# Patient Record
Sex: Female | Born: 1970
Health system: Southern US, Community
[De-identification: ages and names within clinical notes are randomized; demographics above are authoritative.]

## PROBLEM LIST (undated history)

## (undated) DIAGNOSIS — F4310 Post-traumatic stress disorder, unspecified: Secondary | ICD-10-CM

## (undated) DIAGNOSIS — E785 Hyperlipidemia, unspecified: Secondary | ICD-10-CM

## (undated) DIAGNOSIS — F909 Attention-deficit hyperactivity disorder, unspecified type: Secondary | ICD-10-CM

## (undated) DIAGNOSIS — E559 Vitamin D deficiency, unspecified: Secondary | ICD-10-CM

## (undated) DIAGNOSIS — F319 Bipolar disorder, unspecified: Secondary | ICD-10-CM

## (undated) DIAGNOSIS — F419 Anxiety disorder, unspecified: Secondary | ICD-10-CM

## (undated) HISTORY — DX: Bipolar disorder, unspecified: F31.9

## (undated) HISTORY — DX: Vitamin D deficiency, unspecified: E55.9

## (undated) HISTORY — PX: TUBAL LIGATION: SHX77

## (undated) HISTORY — PX: CARPAL TUNNEL RELEASE: SHX101

## (undated) HISTORY — DX: Anxiety disorder, unspecified: F41.9

## (undated) HISTORY — PX: COLONOSCOPY: SHX174

## (undated) HISTORY — DX: Hyperlipidemia, unspecified: E78.5

---

## 2003-11-23 ENCOUNTER — Emergency Department (HOSPITAL_COMMUNITY): Admission: EM | Admit: 2003-11-23 | Discharge: 2003-11-23 | Payer: Self-pay | Admitting: Emergency Medicine

## 2010-05-04 ENCOUNTER — Encounter: Payer: Self-pay | Admitting: Internal Medicine

## 2011-09-30 DIAGNOSIS — Z9851 Tubal ligation status: Secondary | ICD-10-CM | POA: Insufficient documentation

## 2012-09-25 ENCOUNTER — Ambulatory Visit (INDEPENDENT_AMBULATORY_CARE_PROVIDER_SITE_OTHER): Payer: Self-pay | Admitting: Internal Medicine

## 2012-09-25 ENCOUNTER — Ambulatory Visit: Payer: Self-pay

## 2012-09-25 VITALS — BP 130/82 | HR 79 | Temp 98.3°F | Resp 16 | Ht 62.25 in | Wt 203.0 lb

## 2012-09-25 DIAGNOSIS — F411 Generalized anxiety disorder: Secondary | ICD-10-CM

## 2012-09-25 DIAGNOSIS — F988 Other specified behavioral and emotional disorders with onset usually occurring in childhood and adolescence: Secondary | ICD-10-CM

## 2012-09-25 DIAGNOSIS — Z6836 Body mass index (BMI) 36.0-36.9, adult: Secondary | ICD-10-CM

## 2012-09-25 MED ORDER — METHYLPHENIDATE HCL 20 MG PO TABS: 20.0000 mg | ORAL_TABLET | Freq: Three times a day (TID) | ORAL | Status: DC

## 2012-09-25 NOTE — Progress Notes (Signed)
Subjective:    Patient ID: Melissa Davies, female    DOB: Sep 20, 1970, 42 y.o.   MRN: [id number]   Patient in today seeking a primary care physician and is requesting a refill on her Methylphenidate 20mg . She was a former patient at   [facility]. She was first diagnosed by Dr Carolanne Grumbling and she was being treated for depression and anxiety. And he  Retired, she was referred to [facility] but has not been happy with her treatment there. Dr. Ladona Ridgel discovered that she was suffering from adult attention deficit disorder and when he started her on stimulants or anxiety disappeared. She has been on Methylphenidate for the past 2 years. Patient is very talkative during visit, she became tearful during the visit as well when discussing her treatment at [facility]. Her fiance is present in the room. He mentions that patient has been scratching her head a lot and leaving sores in her scalp. Patient admits that she has been scratching her head, but it this is only because of her nerves. She becomes tearful when discussing the issue. 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  She was able to go back to school and completed a degree cosmetology/medicine helps greatly at work/she rarely takes it on the weekend    Anxiety Presents for initial visit. Onset was 1 to 4 weeks ago. The problem has been unchanged. Symptoms include decreased concentration, depressed mood, irritability, nervous/anxious behavior, panic and restlessness. Patient reports no chest pain, compulsions, dizziness, hyperventilation, nausea, shortness of breath or suicidal ideas. Symptoms occur constantly. The severity of symptoms is interfering with daily activities and moderate. The symptoms are aggravated by medication withdrawal (Symptpms onset when Methylphenidate 20mg  ran out). The quality of sleep is fair. Nighttime awakenings: occasional.   Her past medical history is significant for depression. (ADHD)  Treatments tried: Methylphenidate 20mg . The treatment provided significant relief. Compliance with prior treatments has been good.      Review of Systems  Constitutional: Positive for activity change (doing more as she feels more anxious), irritability and fatigue (is working 7 days/week). Negative for fever and chills.  HENT: Negative for trouble swallowing, neck pain, neck stiffness, sinus pressure and tinnitus.   Eyes: Negative.  Negative for visual disturbance.  Respiratory: Negative for cough, chest tightness and shortness of breath.   Cardiovascular: Negative for chest pain.  Gastrointestinal: Positive for constipation. Negative for nausea, vomiting, abdominal pain and abdominal distention.  Endocrine: Negative.  Negative for cold intolerance and heat intolerance.  Genitourinary: Negative.   Musculoskeletal: Negative for back pain.  Skin: Negative.   Allergic/Immunologic: Negative.   Neurological: Negative for dizziness, tremors, weakness, light-headedness, numbness and headaches.  Psychiatric/Behavioral: Positive for decreased concentration. Negative for suicidal ideas. The patient is nervous/anxious.        Objective:   Physical Exam  Constitutional: She is oriented to person, place, and time. She appears well-developed and well-nourished.  HENT:  Head: Normocephalic and atraumatic.  Mouth/Throat: Oropharynx is clear and moist.  Eyes: Conjunctivae are normal. Pupils are equal, round, and reactive to light.  Neck: Normal range of motion. Neck supple.  Cardiovascular: Normal rate, regular rhythm and normal heart sounds.  Exam reveals no gallop and no friction rub.   No murmur heard. Pulmonary/Chest: Effort normal and breath sounds normal. No respiratory distress. She has no wheezes. She exhibits no tenderness.  Abdominal: Soft. Bowel sounds are normal.  Musculoskeletal: Normal range of motion.  Neurological: She  is alert and oriented to person, place, and time.  Skin: Skin  is warm and dry.  Psychiatric: Her speech is normal. Judgment and thought content normal. Her mood appears anxious. She is hyperactive. Cognition and memory are normal.  Alternating mood- between hyperactive/talkitive to crying          Assessment & Plan:   Assessment- ADHD--   Generalized anxiety disorder   BMI 36                      - Medication refill  Plan- Methylphenidate 20mg          -Return in 3 months          -Education provided regarding anxiety and irritability triggers  Meds ordered this encounter  Medications  . buPROPion (WELLBUTRIN XL) 150 MG 24 hr tablet----she has plenty of this per Dr Karie Schwalbe    Carol Ada: Take 450 mg by mouth daily.  . methylphenidate (RITALIN) 20 MG tablet    Sig: Take 1 tablet (20 mg total) by mouth 3 (three) times daily.    Dispense:  90 tablet    Refill:  0  . methylphenidate (RITALIN) 20 MG tablet    Sig: Take 1 tablet (20 mg total) by mouth 3 (three) times daily.    Dispense:  90 tablet    Refill:  0  . methylphenidate (RITALIN) 20 MG tablet    Sig: Take 1 tablet (20 mg total) by mouth 3 (three) times daily. 10/26/14    Dispense:  90 tablet    Refill:  0  . methylphenidate (RITALIN) 20 MG tablet    Sig: Take 1 tablet (20 mg total) by mouth 3 (three) times daily. 11/25/12    Dispense:  90 tablet    Refill:  0

## 2012-09-26 DIAGNOSIS — Z6836 Body mass index (BMI) 36.0-36.9, adult: Secondary | ICD-10-CM | POA: Insufficient documentation

## 2012-09-26 DIAGNOSIS — F988 Other specified behavioral and emotional disorders with onset usually occurring in childhood and adolescence: Secondary | ICD-10-CM | POA: Insufficient documentation

## 2012-09-26 DIAGNOSIS — F411 Generalized anxiety disorder: Secondary | ICD-10-CM | POA: Insufficient documentation

## 2012-09-28 NOTE — Progress Notes (Signed)
Left msg for pt to schedule 3 month f-up with Dr. Merla Riches.

## 2012-09-29 NOTE — Progress Notes (Signed)
Appt made for 12/28/12.

## 2012-12-07 ENCOUNTER — Telehealth: Payer: Self-pay

## 2012-12-07 NOTE — Telephone Encounter (Signed)
buPROPion (WELLBUTRIN XL) 150 MG 24 hr tablet methylphenidate (RITALIN) 20 MG tablet   Patient misplaced her scripts when she recently moved.  Needs her meds.    [phone]

## 2012-12-10 NOTE — Telephone Encounter (Signed)
In your box for review

## 2012-12-10 NOTE — Telephone Encounter (Signed)
Needs nccsrs query 1st

## 2012-12-12 MED ORDER — BUPROPION HCL ER (XL) 150 MG PO TB24: 450.0000 mg | ORAL_TABLET | Freq: Every day | ORAL | Status: DC

## 2012-12-12 NOTE — Telephone Encounter (Signed)
Patient advised.

## 2012-12-12 NOTE — Telephone Encounter (Signed)
Reordered wellbutrin/but can't replace lost rx for controlled substances

## 2012-12-28 ENCOUNTER — Encounter: Payer: Self-pay | Admitting: Internal Medicine

## 2012-12-28 ENCOUNTER — Ambulatory Visit: Payer: Self-pay | Admitting: Internal Medicine

## 2012-12-28 VITALS — BP 112/70 | HR 82 | Temp 99.4°F | Resp 16 | Ht 61.0 in | Wt 199.0 lb

## 2012-12-28 DIAGNOSIS — F411 Generalized anxiety disorder: Secondary | ICD-10-CM

## 2012-12-28 DIAGNOSIS — F988 Other specified behavioral and emotional disorders with onset usually occurring in childhood and adolescence: Secondary | ICD-10-CM

## 2012-12-28 MED ORDER — CLONAZEPAM 1 MG PO TABS: ORAL_TABLET | ORAL | Status: DC

## 2012-12-28 MED ORDER — BUPROPION HCL ER (XL) 150 MG PO TB24: 450.0000 mg | ORAL_TABLET | Freq: Every day | ORAL | Status: DC

## 2012-12-28 MED ORDER — METHYLPHENIDATE HCL 20 MG PO TABS: 20.0000 mg | ORAL_TABLET | Freq: Three times a day (TID) | ORAL | Status: DC

## 2012-12-28 NOTE — Progress Notes (Signed)
  Subjective:    Patient ID: Melissa Davies, female    DOB: February 03, 1971, 42 y.o.   MRN: [id number]  HPIf/u Transfer from Dr Diona Browner Patient Active Problem List   Diagnosis Date Noted  . ADD (attention deficit disorder) 09/26/2012  . Anxiety state, unspecified 09/26/2012  . 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 methylphenidate (RITALIN) 20 MG tablet    Sig: Take 1 tablet (20 mg total) by mouth 3 (three) times daily.    Dispense:  90 tablet    Refill:  0  . methylphenidate (RITALIN) 20 MG tablet    Sig: Take 1 tablet (20 mg total) by mouth 3 (three) times daily. For 01/26/13    Dispense:  90 tablet    Refill:  0  . methylphenidate (RITALIN) 20 MG tablet    Sig: Take 1 tablet (20 mg total) by mouth 3 (three) times daily. 02/26/15    Dispense:  90 tablet    Refill:  0                 . clonazePAM (KLONOPIN) 1 MG tablet    Sig: One as needed for panic attack    Dispense:  20 tablet    Refill:  1  . buPROPion (WELLBUTRIN XL) 150 MG 24 hr tablet    Sig: Take 3 tablets (450 mg total) by mouth daily.    Dispense:  90 tablet    Refill:  2   Reck 3mos Ref daughter to famserv and yfoc

## 2013-03-29 ENCOUNTER — Ambulatory Visit: Payer: Self-pay | Admitting: Internal Medicine

## 2013-04-03 ENCOUNTER — Ambulatory Visit: Payer: Self-pay | Admitting: Internal Medicine

## 2013-05-03 ENCOUNTER — Ambulatory Visit (INDEPENDENT_AMBULATORY_CARE_PROVIDER_SITE_OTHER): Payer: 59 | Admitting: Emergency Medicine

## 2013-05-03 VITALS — BP 126/88 | HR 103 | Temp 98.1°F | Resp 18 | Ht 65.0 in | Wt 209.0 lb

## 2013-05-03 DIAGNOSIS — J209 Acute bronchitis, unspecified: Secondary | ICD-10-CM

## 2013-05-03 MED ORDER — AZITHROMYCIN 250 MG PO TABS: ORAL_TABLET | ORAL | Status: DC

## 2013-05-03 MED ORDER — GUAIFENESIN-DM 100-10 MG/5ML PO SYRP: 5.0000 mL | ORAL_SOLUTION | ORAL | Status: DC | PRN

## 2013-05-03 NOTE — Progress Notes (Signed)
[facility] [address] [place] [address] [phone]  Date:  05/03/2013   Name:  Melissa Davies   DOB:  [birth year]   MRN:  [id number]  PCP:  No PCP Per Patient    Chief Complaint: Chills, Fever, Dizziness, Otalgia, Sore Throat, Sinus Congestion and Cough   History of Present Illness:  Melissa Davies is a 43 y.o. very pleasant female patient who presents with the following:  Ill two weeks with cough.  Dizzy, nasal congestion.  Has malaise and myalgias with fatigue since Friday.  Cough productive of purulent sputum.  No wheezing or shortness of breath.  No nausea or vomiting.  No rash or stool change.  Sore throat. Temperatures in the 100 range.  No improvement with over the counter medications or other home remedies. Denies other complaint or health concern today.   Patient Active Problem List   Diagnosis Date Noted  . ADD (attention deficit disorder) 09/26/2012  . Anxiety state, unspecified 09/26/2012  . BMI 36.0-36.9,adult 09/26/2012    Past Medical History  Diagnosis Date  . Anxiety     Past Surgical History  Procedure Laterality Date  . Tubal ligation      History  Substance Use Topics  . Smoking status: Former Games developer  . Smokeless tobacco: Not on file  . Alcohol Use: No    Family History  Problem Relation Age of Onset  . ADD / ADHD Son     No Known Allergies  Medication list has been reviewed and updated.  Current Outpatient Prescriptions on File Prior to Visit  Medication Sig Dispense Refill  . buPROPion (WELLBUTRIN XL) 150 MG 24 hr tablet Take 3 tablets (450 mg total) by mouth daily.  90 tablet  2  . methylphenidate (RITALIN) 20 MG tablet Take 1 tablet (20 mg total) by mouth 3 (three) times daily. For 01/26/13  90 tablet  0  . clonazePAM (KLONOPIN) 1 MG tablet One as needed for panic attack  20 tablet  1  . methylphenidate (RITALIN) 20 MG tablet Take 1 tablet (20 mg total) by mouth 3 (three) times daily. 11/25/12  90  tablet  0  . methylphenidate (RITALIN) 20 MG tablet Take 1 tablet (20 mg total) by mouth 3 (three) times daily.  90 tablet  0  . methylphenidate (RITALIN) 20 MG tablet Take 1 tablet (20 mg total) by mouth 3 (three) times daily. 02/26/15  90 tablet  0   No current facility-administered medications on file prior to visit.    Review of Systems:  As per HPI, otherwise negative.    Physical Examination: Filed Vitals:   05/03/13 1340  BP: 126/88  Pulse: 103  Temp: 98.1 F (36.7 C)  Resp: 18   Filed Vitals:   05/03/13 1340  Height: 5\' 5"  (1.651 m)  Weight: 209 lb (94.802 kg)   Body mass index is 34.78 kg/(m^2). Ideal Body Weight: Weight in (lb) to have BMI = 25: 149.9  GEN: WDWN, NAD, Non-toxic, A & O x 3 HEENT: Atraumatic, Normocephalic. Neck supple. No masses, No LAD. Ears and Nose: No external deformity. CV: RRR, No M/G/R. No JVD. No thrill. No extra heart sounds. PULM: CTA B, no wheezes, crackles, rhonchi. No retractions. No resp. distress. No accessory muscle use. ABD: S, NT, ND, +BS. No rebound. No HSM. EXTR: No c/c/e NEURO Normal gait.  PSYCH: Normally interactive. Conversant. Not depressed or anxious appearing.  Calm demeanor.    Assessment and Plan: Bronchitis  zpak Robitussin dm  Signed,  Phillips OdorJeffery Jaelon Gatley, MD

## 2013-05-03 NOTE — Patient Instructions (Signed)

## 2013-05-15 NOTE — Progress Notes (Signed)
PA approved for Ritalin through 05/11/14. Pharm faxed.

## 2013-05-24 ENCOUNTER — Ambulatory Visit (INDEPENDENT_AMBULATORY_CARE_PROVIDER_SITE_OTHER): Payer: 59 | Admitting: Internal Medicine

## 2013-05-24 ENCOUNTER — Encounter: Payer: Self-pay | Admitting: Internal Medicine

## 2013-05-24 VITALS — BP 128/84 | HR 83 | Temp 98.3°F | Resp 16 | Ht 60.5 in | Wt 205.4 lb

## 2013-05-24 DIAGNOSIS — F329 Major depressive disorder, single episode, unspecified: Secondary | ICD-10-CM

## 2013-05-24 DIAGNOSIS — Z23 Encounter for immunization: Secondary | ICD-10-CM

## 2013-05-24 DIAGNOSIS — F988 Other specified behavioral and emotional disorders with onset usually occurring in childhood and adolescence: Secondary | ICD-10-CM

## 2013-05-24 DIAGNOSIS — F411 Generalized anxiety disorder: Secondary | ICD-10-CM

## 2013-05-24 MED ORDER — BUPROPION HCL ER (XL) 150 MG PO TB24: 450.0000 mg | ORAL_TABLET | Freq: Every day | ORAL | Status: DC

## 2013-05-24 MED ORDER — METHYLPHENIDATE HCL 20 MG PO TABS: 20.0000 mg | ORAL_TABLET | Freq: Three times a day (TID) | ORAL | Status: DC

## 2013-05-24 MED ORDER — MIRTAZAPINE 30 MG PO TABS: 30.0000 mg | ORAL_TABLET | Freq: Every day | ORAL | Status: DC

## 2013-05-24 MED ORDER — ALPRAZOLAM 0.25 MG PO TABS: 0.2500 mg | ORAL_TABLET | Freq: Two times a day (BID) | ORAL | Status: DC | PRN

## 2013-05-24 NOTE — Progress Notes (Signed)
Subjective:    Patient ID: Melissa Davies, female    DOB: 26-May-1970, 43 y.o.   MRN: [id number] This chart was scribed for [name], MD by Valera Castle, ED Scribe. This patient was seen in room 24 and the patient's care was started at 1:17 PM.  Chief Complaint  Patient presents with  . Follow-up    medications   HPI Melissa Davies is a 43 y.o. female Pt presents for a medication follow up.   She states her medications are not working. She reports breaking out in a rash 09/14 due to the Klonopin she was taking for anxiety. She reports needing Wellbutrin 150 mg to get out of bed in the mornings, but states the Ritalin 20 mg she has been taking for her h/o ADD has not been working well for her.   She reports constant anxiety, stress, anger, lack of patience from dealing with her ex husband. She reports it is hard to ignore her ex husband since they have kids together and he is manipulative. She states her daughter has been getting into trouble, which has exacerbated the situation. She has been involved in ongoing court trials with him, stating she is having trouble with court decisions, justice. She reports crying for no reason the other day while driving and states she doesn't know what is going on. She reports her current husband has been supporting her well, but is still having a hard time dealing with everything.   She reports trouble sleeping at night due to her symptoms. She reports after first getting divorced she had headaches for 6 months. She states she took Xanax prescribed her for her panic attacks at night, and after taking one tablet she would fall asleep right away. She reports needing something that won't make her quite as drowsy.   PCP - No PCP Per Patient  Patient Active Problem List   Diagnosis Date Noted  . ADD (attention deficit disorder) 09/26/2012  . Anxiety state, unspecified 09/26/2012  . BMI 36.0-36.9,adult 09/26/2012   Prior to Admission  medications   Medication Sig Start Date End Date Taking? Authorizing Provider  azithromycin (ZITHROMAX) 250 MG tablet Take 2 tabs PO x 1 dose, then 1 tab PO QD x 4 days 05/03/13   Phillips Odor, MD  buPROPion (WELLBUTRIN XL) 150 MG 24 hr tablet Take 3 tablets (450 mg total) by mouth daily. 12/28/12   Tonye Pearson, MD  clonazePAM Scarlette Calico) 1 MG tablet One as needed for panic attack 12/28/12   Tonye Pearson, MD  guaiFENesin-dextromethorphan [facility] DM) 100-10 MG/5ML syrup Take 5 mLs by mouth every 4 (four) hours as needed for cough. 05/03/13   Phillips Odor, MD  methylphenidate (RITALIN) 20 MG tablet Take 1 tablet (20 mg total) by mouth 3 (three) times daily. 11/25/12 09/25/12   Tonye Pearson, MD  methylphenidate (RITALIN) 20 MG tablet Take 1 tablet (20 mg total) by mouth 3 (three) times daily. 12/28/12   Tonye Pearson, MD  methylphenidate (RITALIN) 20 MG tablet Take 1 tablet (20 mg total) by mouth 3 (three) times daily. For 01/26/13 12/28/12   Tonye Pearson, MD  methylphenidate (RITALIN) 20 MG tablet Take 1 tablet (20 mg total) by mouth 3 (three) times daily. 02/26/15 12/28/12   Tonye Pearson, MD   Review of Systems  Psychiatric/Behavioral: Positive for dysphoric mood, decreased concentration and agitation. Negative for suicidal ideas and self-injury. The patient is nervous/anxious.       Objective:  Physical Exam Nursing note and vitals reviewed. Constitutional: Pt is oriented to person, place, and time. Pt appears well-developed and well-nourished.  HENT:  Head: Normocephalic and atraumatic.  Eyes: EOM are normal. Pupils are equal, round, and reactive to light.  Neck: Neck supple.  Cardiovascular: Normal rate. Pulmonary/Chest: Effort normal. Abdominal:  Musculoskeletal: Normal range of motion.  Neurological: Pt is alert and oriented to person, place, and time.  Skin: Skin is warm and dry.  Psychiatric: Thought content normal. Her mood appears anxious,  dysphoric.   BP 128/84  Pulse 83  Temp(Src) 98.3 F (36.8 C) (Oral)  Resp 16  Ht 5' 0.5" (1.537 m)  Wt 205 lb 6.4 oz (93.169 kg)  BMI 39.44 kg/m2  SpO2 99%  LMP 05/17/2013     Assessment & Plan:   ADD (attention deficit disorder)  Anxiety state, unspecified  Need for prophylactic vaccination and inoculation against influenza - Plan: Flu Vaccine QUAD 36+ mos IM  Depression, reactive  Meds ordered this encounter  Medications  . mirtazapine (REMERON) 30 MG tablet                added     Sig: Take 1 tablet (30 mg total) by mouth at bedtime.    Dispense:  30 tablet    Refill:  1  . buPROPion (WELLBUTRIN XL) 150 MG 24 hr tablet            continued     Sig: Take 3 tablets (450 mg total) by mouth daily.    Dispense:  90 tablet    Refill:  2  . methylphenidate (RITALIN) 20 MG tablet    Sig: Take 1 tablet (20 mg total) by mouth 3 (three) times daily. 02/26/15    Dispense:  90 tablet    Refill:  0  . methylphenidate (RITALIN) 20 MG tablet    Sig: Take 1 tablet (20 mg total) by mouth 3 (three) times daily. For 01/26/13    Dispense:  90 tablet    Refill:  0  . methylphenidate (RITALIN) 20 MG tablet    Sig: Take 1 tablet (20 mg total) by mouth 3 (three) times daily.    Dispense:  90 tablet    Refill:  0  . ALPRAZolam (XANAX) 0.25 MG tablet                 trial to control panic     Sig: Take 1-2 tablets (0.25-0.5 mg total) by mouth 2 (two) times daily as needed for anxiety.    Dispense:  60 tablet    Refill:  0  Followup one to 2 months     I have completed the patient encounter in its entirety as documented by the scribe, with editing by me where necessary. Trysten Bernard P. Merla Richesoolittle, M.D.

## 2013-05-26 ENCOUNTER — Telehealth: Payer: Self-pay

## 2013-05-26 NOTE — Telephone Encounter (Signed)
[facility] called stating that pt dropped off 3 Rxs for Ritalin written on 05/24/13. The one that doesn't have a "may fill on" date can be filled, but the other two have incorrect "may fill" dates on them (one for 01/26/13 and one for 02/26/15). She stated that she will destroy these two Rxs if we can rewrite them and have pt p/up. I have pended.

## 2013-05-27 MED ORDER — METHYLPHENIDATE HCL 20 MG PO TABS: 20.0000 mg | ORAL_TABLET | Freq: Three times a day (TID) | ORAL | Status: DC

## 2013-05-27 NOTE — Telephone Encounter (Signed)
Meds ordered this encounter  Medications  . methylphenidate (RITALIN) 20 MG tablet    Sig: Take 1 tablet (20 mg total) by mouth 3 (three) times daily. May fill on/after  06/23/13.    Dispense:  90 tablet    Refill:  0  . methylphenidate (RITALIN) 20 MG tablet    Sig: Take 1 tablet (20 mg total) by mouth 3 (three) times daily. May fill on/after 07/23/13.    Dispense:  90 tablet    Refill:  0

## 2013-05-31 ENCOUNTER — Telehealth: Payer: Self-pay

## 2013-05-31 NOTE — Telephone Encounter (Signed)
Pt has questions about her medication she feels that the pharmacy may have had a mix up in her meds   Best number [phone]

## 2013-06-01 NOTE — Telephone Encounter (Signed)
Advised pt the Remeron was called into the pharmacy for her by Dr. Merla Richesoolittle during her visit on 2/11.

## 2013-06-11 ENCOUNTER — Telehealth: Payer: Self-pay

## 2013-06-11 NOTE — Telephone Encounter (Signed)
mirtazapine 

## 2013-06-11 NOTE — Telephone Encounter (Signed)
Pt was put on a different medicine that she takes at night (she cant remember the name of it).  She says she is feeling worse and it is doing the opposite of what it is supposed to do.   She was pretty vague other than that. Please call [phone]

## 2013-06-12 NOTE — Telephone Encounter (Signed)
Can d/c remeron///can try flexeril 10 hs next#30 1 ref

## 2013-06-13 ENCOUNTER — Other Ambulatory Visit: Payer: Self-pay | Admitting: *Deleted

## 2013-06-13 MED ORDER — CYCLOBENZAPRINE HCL 10 MG PO TABS: 10.0000 mg | ORAL_TABLET | Freq: Every day | ORAL | Status: DC

## 2013-06-13 NOTE — Telephone Encounter (Signed)
Pt notified to stop Remeron, Flexeril sent to pharmacy.

## 2013-07-12 ENCOUNTER — Encounter: Payer: Self-pay | Admitting: Internal Medicine

## 2013-07-12 ENCOUNTER — Ambulatory Visit (INDEPENDENT_AMBULATORY_CARE_PROVIDER_SITE_OTHER): Payer: 59 | Admitting: Internal Medicine

## 2013-07-12 VITALS — BP 136/84 | HR 91 | Temp 98.7°F | Resp 16 | Ht 60.0 in | Wt 206.4 lb

## 2013-07-12 DIAGNOSIS — F988 Other specified behavioral and emotional disorders with onset usually occurring in childhood and adolescence: Secondary | ICD-10-CM

## 2013-07-12 DIAGNOSIS — F411 Generalized anxiety disorder: Secondary | ICD-10-CM

## 2013-07-12 DIAGNOSIS — F329 Major depressive disorder, single episode, unspecified: Secondary | ICD-10-CM

## 2013-07-12 DIAGNOSIS — F341 Dysthymic disorder: Secondary | ICD-10-CM

## 2013-07-12 MED ORDER — BUPROPION HCL ER (XL) 150 MG PO TB24: 450.0000 mg | ORAL_TABLET | Freq: Every day | ORAL | Status: DC

## 2013-07-12 MED ORDER — METHYLPHENIDATE HCL 20 MG PO TABS: 20.0000 mg | ORAL_TABLET | Freq: Three times a day (TID) | ORAL | Status: DC

## 2013-07-12 MED ORDER — ALPRAZOLAM 0.5 MG PO TABS: 0.5000 mg | ORAL_TABLET | Freq: Two times a day (BID) | ORAL | Status: DC | PRN

## 2013-07-12 MED ORDER — ALPRAZOLAM 0.25 MG PO TABS: 0.2500 mg | ORAL_TABLET | Freq: Two times a day (BID) | ORAL | Status: DC | PRN

## 2013-07-12 NOTE — Progress Notes (Signed)
This chart was scribed for Tonye Pearsonobert P Taylia Berber, MD by Tana ConchStephen Methvin, ED Scribe. This patient was seen in room 24 and the patient's care was started at 2:28 PM .  Subjective:    Patient ID: Melissa Davies, female    DOB: 01/22/1971, 43 y.o.   MRN: [id number]  HPI   HPI Comments: Melissa BarrierRoberta G Sees is a 43 y.o. female with h/o anxiety who [facility]u. Pt reports that she has trouble falling asleep, reports being awake till 2am and being"tired but unable to sleep". Pt reports that taking Remeron had negative effects for her, she could not wake up or function after taking it. Pt reports that she has been taking 2 Xanax before she goes into work and only 2 of her methylphenidate. Pt reports that driving [employer] stresses her to the point that she is looking for a way to get driven to work.Pt reports that she "definately needs to talk to a psychiatrist", but that she is scared of talking to one. Pt wonders why she snaps sometimes when someone says something to her. Pt feels like she "balls things up" too often. Pt reports that she works 7 days a week often. Pt reports that she could have been "admitted" this past Sunday, she was in a bad state and snapped at really small things. Pt states that she was made really upset when she was watching TV and was interrupted and that she pushed the door so hard it knocked a hole in the wall.  Pt reports that she feels like she is not "nice" based off other peoples reactions to her. Pt reports that at times she says things in a manner that people misunderstand to be mean. Pt reports that she is impulsive.  Pt reports that Wellbutrin really provides effect and that she could really tell the difference in dosage when it was changed(she discontinued this medicine by mistake rather than adding t Remeron at bedtime) after her last visit. She is worried about being addicted to it/meds in general. She is worried about having a  psychiatric diagnosis and has refused to establish counseling because of this.    Review of Systems  Psychiatric/Behavioral: Positive for sleep disturbance (sleeping too much with Remeron, she has trouble falling asleep) and agitation (reports implulsive behavior). Negative for confusion. The patient is nervous/anxious.   All other systems reviewed and are negative.       Objective:   Physical Exam  Nursing note and vitals reviewed. Constitutional: She is oriented to person, place, and time. She appears well-developed and well-nourished.  Neurological: She is alert and oriented to person, place, and time.  Psychiatric: Her mood appears anxious. She is agitated. She expresses impulsivity.  There frequent tears during the discussion as she acknowledges the depth of her anxiety and how pervasive it is. Her depression is secondary to this and is also related to her poor self-esteem.  BP 136/84  Pulse 91  Temp(Src) 98.7 F (37.1 C) (Oral)  Resp 16  Ht 5' (1.524 m)  Wt 206 lb 6.4 oz (93.622 kg)  BMI 40.31 kg/m2  SpO2 100%  LMP 07/09/2013   40 minute discussion covering behavioral techniques in addition to above    Assessment & Plan:  ADD (attention deficit disorder)  Anxiety state, unspecified  Depression, reactive  Meds ordered this encounter  Medications  . buPROPion (WELLBUTRIN XL) 150 MG 24 hr tablet    Sig: Take 3 tablets (450  mg total) by mouth daily.    Dispense:  90 tablet    Refill:  2  . DISCONTD: ALPRAZolam (XANAX) 0.25 MG tablet    Sig: Take 1-2 tablets (0.25-0.5 mg total) by mouth 2 (two) times daily as needed for anxiety.    Dispense:  60 tablet    Refill:  0  . methylphenidate (RITALIN) 20 MG tablet    Sig: Take 1 tablet (20 mg total) by mouth 3 (three) times daily. For 01/26/13    Dispense:  90 tablet    Refill:  0  . ALPRAZolam (XANAX) 0.5 MG tablet    Sig: Take 1 tablet (0.5 mg total) by mouth 2 (two) times daily as needed for anxiety.    Dispense:   60 tablet    Refill:  1   Transportation to work so she can avoid driving Patient Instructions  [facility] Berniece Andreas or Blanchard Mane or Evalina Field  [phone] or go to website  [employer] transportation      2:55 PM-Discussed treatment plan which includes changing her dosage of xanax and remeron with pt at bedside and pt agreed to plan.    I personally performed the services described in this documentation, which was scribed in my presence. The recorded information has been reviewed and is accurate.

## 2013-07-12 NOTE — Patient Instructions (Signed)
[facility] Melissa Davies or Melissa Davies or Melissa Davies  [phone] or go to website  [employer]

## 2013-07-12 NOTE — Progress Notes (Deleted)
   Subjective:    Patient ID: Melissa Davies, female    DOB: 03/30/1971, 43 y.o.   MRN: [id number]  HPI    Review of Systems     Objective:   Physical Exam        Assessment & Plan:

## 2013-09-13 ENCOUNTER — Ambulatory Visit (INDEPENDENT_AMBULATORY_CARE_PROVIDER_SITE_OTHER): Payer: 59 | Admitting: Internal Medicine

## 2013-09-13 ENCOUNTER — Encounter: Payer: Self-pay | Admitting: Internal Medicine

## 2013-09-13 VITALS — BP 116/76 | HR 81 | Temp 98.8°F | Resp 16 | Ht 61.5 in | Wt 206.8 lb

## 2013-09-13 DIAGNOSIS — F988 Other specified behavioral and emotional disorders with onset usually occurring in childhood and adolescence: Secondary | ICD-10-CM

## 2013-09-13 DIAGNOSIS — F4310 Post-traumatic stress disorder, unspecified: Secondary | ICD-10-CM

## 2013-09-13 DIAGNOSIS — F329 Major depressive disorder, single episode, unspecified: Secondary | ICD-10-CM

## 2013-09-13 DIAGNOSIS — F341 Dysthymic disorder: Secondary | ICD-10-CM

## 2013-09-13 DIAGNOSIS — F411 Generalized anxiety disorder: Secondary | ICD-10-CM

## 2013-09-13 MED ORDER — BUPROPION HCL ER (XL) 150 MG PO TB24: 450.0000 mg | ORAL_TABLET | Freq: Every day | ORAL | Status: DC

## 2013-09-13 MED ORDER — METHYLPHENIDATE HCL 20 MG PO TABS: 20.0000 mg | ORAL_TABLET | Freq: Three times a day (TID) | ORAL | Status: DC

## 2013-09-13 MED ORDER — ALPRAZOLAM 0.5 MG PO TABS: 0.5000 mg | ORAL_TABLET | Freq: Two times a day (BID) | ORAL | Status: DC | PRN
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
## 2013-11-20 ENCOUNTER — Encounter (HOSPITAL_COMMUNITY): Payer: Self-pay | Admitting: Emergency Medicine

## 2013-11-20 ENCOUNTER — Emergency Department (HOSPITAL_COMMUNITY): Payer: Self-pay

## 2013-11-20 ENCOUNTER — Emergency Department (HOSPITAL_COMMUNITY)
Admission: EM | Admit: 2013-11-20 | Discharge: 2013-11-20 | Disposition: A | Discharge status: Home / Self Care | Payer: Self-pay | Attending: Emergency Medicine | Admitting: Emergency Medicine

## 2013-11-20 DIAGNOSIS — S99919A Unspecified injury of unspecified ankle, initial encounter: Principal | ICD-10-CM

## 2013-11-20 DIAGNOSIS — Z87891 Personal history of nicotine dependence: Secondary | ICD-10-CM | POA: Insufficient documentation

## 2013-11-20 DIAGNOSIS — S99922A Unspecified injury of left foot, initial encounter: Secondary | ICD-10-CM

## 2013-11-20 DIAGNOSIS — W208XXA Other cause of strike by thrown, projected or falling object, initial encounter: Secondary | ICD-10-CM | POA: Insufficient documentation

## 2013-11-20 DIAGNOSIS — F411 Generalized anxiety disorder: Secondary | ICD-10-CM | POA: Insufficient documentation

## 2013-11-20 DIAGNOSIS — Y9389 Activity, other specified: Secondary | ICD-10-CM | POA: Insufficient documentation

## 2013-11-20 DIAGNOSIS — Y9289 Other specified places as the place of occurrence of the external cause: Secondary | ICD-10-CM | POA: Insufficient documentation

## 2013-11-20 DIAGNOSIS — S99929A Unspecified injury of unspecified foot, initial encounter: Principal | ICD-10-CM

## 2013-11-20 DIAGNOSIS — Z79899 Other long term (current) drug therapy: Secondary | ICD-10-CM | POA: Insufficient documentation

## 2013-11-20 DIAGNOSIS — S8990XA Unspecified injury of unspecified lower leg, initial encounter: Secondary | ICD-10-CM | POA: Insufficient documentation

## 2013-11-20 MED ORDER — HYDROCODONE-ACETAMINOPHEN 5-325 MG PO TABS
2.0000 | ORAL_TABLET | Freq: Once | ORAL | Status: AC
  Administered 2013-11-20: 2 via ORAL
  Filled 2013-11-20: qty 2

## 2013-11-20 MED ORDER — TRAMADOL HCL 50 MG PO TABS: 50.0000 mg | ORAL_TABLET | Freq: Four times a day (QID) | ORAL | Status: DC | PRN

## 2013-11-20 NOTE — Discharge Instructions (Signed)
Take the prescribed medication as directed.  May take with tylenol if needed. May wish to ice and elevate foot to help with pain and swelling. Return to the ED for new or worsening symptoms.

## 2013-11-20 NOTE — ED Notes (Signed)
Pt states that she dropped a metal boat seat on her L great toe. Now has pain and bruising. Alert and oriented.

## 2013-11-20 NOTE — ED Provider Notes (Signed)
Medical screening examination/treatment/procedure(s) were performed by non-physician practitioner and as supervising physician I was immediately available for consultation/collaboration.   EKG Interpretation None        Mirian MoMatthew Gentry, MD 11/21/13 1451

## 2013-11-20 NOTE — ED Provider Notes (Signed)
CSN: [id number]     Arrival date & time 11/20/13  1843 History  This chart was scribed for non-physician practitioner Sharilyn Sites working with Mirian Mo, MD by Carl Best, ED Scribe. This patient was seen in room WTR5/WTR5 and the patient's care was started at 7:41 PM.      Chief Complaint  Patient presents with  . Toe Injury   The history is provided by the patient. No language interpreter was used.   HPI Comments: Melissa Davies is a 43 y.o. female who presents to the Emergency Department complaining of constant pain to her left great toe with associated ecchymosis that started after a dropped a metal boat seat on it.  She states that the pain feels like needles.  She will experience intermittent tingling to her left greater toe.  Denies numbness.  She is not allergic to any medications.   Past Medical History  Diagnosis Date  . Anxiety    Past Surgical History  Procedure Laterality Date  . Tubal ligation     Family History  Problem Relation Age of Onset  . ADD / ADHD Son    History  Substance Use Topics  . Smoking status: Former Games developer  . Smokeless tobacco: Not on file  . Alcohol Use: No   OB History   Grav Para Term Preterm Abortions TAB SAB Ect Mult Living                 Review of Systems  Musculoskeletal: Positive for arthralgias and joint swelling.  All other systems reviewed and are negative.     Allergies  Klonopin  Home Medications   Prior to Admission medications   Medication Sig Start Date End Date Taking? Authorizing Provider  ALPRAZolam Prudy Feeler) 0.5 MG tablet Take 1 tablet (0.5 mg total) by mouth 2 (two) times daily as needed for anxiety. 09/13/13  Yes Tonye Pearson, MD  buPROPion (WELLBUTRIN XL) 150 MG 24 hr tablet Take 3 tablets (450 mg total) by mouth daily. 09/13/13  Yes Tonye Pearson, MD  methylphenidate (RITALIN) 20 MG tablet Take 1 tablet (20 mg total) by mouth 3 (three) times daily. May fill on/after 12/12/13 09/13/13  Yes  Tonye Pearson, MD  naproxen sodium (ANAPROX) 220 MG tablet Take 440 mg by mouth 2 (two) times daily as needed (pain).   Yes Historical Provider, MD   Triage Vitals: BP 107/78  Pulse 88  Temp(Src) 98.8 F (37.1 C) (Oral)  SpO2 100%  LMP 11/13/2013  Physical Exam  Nursing note and vitals reviewed. Constitutional: She is oriented to person, place, and time. She appears well-developed and well-nourished.  HENT:  Head: Normocephalic and atraumatic.  Mouth/Throat: Oropharynx is clear and moist.  Eyes: Conjunctivae and EOM are normal. Pupils are equal, round, and reactive to light.  Neck: Normal range of motion.  Cardiovascular: Normal rate, regular rhythm and normal heart sounds.   Pulmonary/Chest: Effort normal and breath sounds normal.  Musculoskeletal: Normal range of motion.       Left foot: She exhibits tenderness, bony tenderness and swelling. She exhibits no deformity.       Feet:  Left great toe with bruising and swelling present; subungual hematoma noted; no laceration or breaks in skin; limited flexion/extension secondary to pain; DP pulse intact; normal cap refill; normal sensation throughout foot  Neurological: She is alert and oriented to person, place, and time.  Skin: Skin is warm and dry.  Psychiatric: She has a normal mood and affect. Her  behavior is normal.    ED Course  Procedures (including critical care time)  DIAGNOSTIC STUDIES: Oxygen Saturation is 100% on room air, normal by my interpretation.    COORDINATION OF CARE: 7:43 PM- Discussed the patient's x-ray results that did not reveal any fractures in the patient's toe.  Discussed discharging the patient with pain medication.  She did not want to wrap the toe.  The patient agreed to the treatment plan.    Labs Review Labs Reviewed - No data to display  Imaging Review Dg Foot Complete Left  11/20/2013   CLINICAL DATA:  Dropped heavy metal object on foot. Foot and toe pain.  EXAM: LEFT FOOT - COMPLETE  3+ VIEW  COMPARISON:  None.  FINDINGS: There is no evidence of fracture or dislocation. There is no evidence of arthropathy or other focal bone abnormality. Soft tissues are unremarkable.  IMPRESSION: Negative.   Electronically Signed   By: Myles RosenthalJohn  Stahl M.D.   On: 11/20/2013 19:38     EKG Interpretation None      MDM   Final diagnoses:  Foot injury, left, initial encounter   Imaging negative for acute fx or dislocation.  Foot NVI.  Will d/c home with pain meds, encouraged RICE routine.  Discussed plan with patient, he/she acknowledged understanding and agreed with plan of care.  Return precautions given for new or worsening symptoms.  I personally performed the services described in this documentation, which was scribed in my presence. The recorded information has been reviewed and is accurate.  Garlon HatchetLisa M Brooklynn Brandenburg, PA-C 11/20/13 1955

## 2013-12-20 ENCOUNTER — Ambulatory Visit (INDEPENDENT_AMBULATORY_CARE_PROVIDER_SITE_OTHER): Payer: 59 | Admitting: Internal Medicine

## 2013-12-20 ENCOUNTER — Encounter: Payer: Self-pay | Admitting: Internal Medicine

## 2013-12-20 VITALS — BP 118/78 | HR 78 | Temp 97.8°F | Resp 16 | Ht 61.5 in | Wt 200.0 lb

## 2013-12-20 DIAGNOSIS — F341 Dysthymic disorder: Secondary | ICD-10-CM

## 2013-12-20 DIAGNOSIS — F329 Major depressive disorder, single episode, unspecified: Secondary | ICD-10-CM

## 2013-12-20 DIAGNOSIS — F988 Other specified behavioral and emotional disorders with onset usually occurring in childhood and adolescence: Secondary | ICD-10-CM

## 2013-12-20 DIAGNOSIS — F411 Generalized anxiety disorder: Secondary | ICD-10-CM

## 2013-12-20 MED ORDER — METHYLPHENIDATE HCL 20 MG PO TABS
20.0000 mg | ORAL_TABLET | Freq: Three times a day (TID) | ORAL | Status: DC
Start: 2013-12-20 — End: 2014-03-21

## 2013-12-20 MED ORDER — ALPRAZOLAM 0.5 MG PO TABS: 0.5000 mg | ORAL_TABLET | Freq: Two times a day (BID) | ORAL | Status: DC | PRN

## 2013-12-20 MED ORDER — BUPROPION HCL ER (XL) 150 MG PO TB24: 450.0000 mg | ORAL_TABLET | Freq: Every day | ORAL | Status: DC

## 2013-12-20 MED ORDER — METHYLPHENIDATE HCL 20 MG PO TABS: 20.0000 mg | ORAL_TABLET | Freq: Three times a day (TID) | ORAL | Status: DC

## 2013-12-20 NOTE — Progress Notes (Signed)
Subjective:    Patient ID: Melissa Davies, female    DOB: May 20, 1970, 43 y.o.   MRN: [id number]  This chart was scribed for Melissa Pearson, MD by Melissa Royalty, ED Scribe. This patient was seen in room 27 and the patient's care was started at 1:41 PM.   HPI  HPI Comments: Melissa Davies is a 43 y.o. female with history PTSD, ADD, anxiety, and depression who presents to the [facility] for 3 month follow up. She states she is doing well with all her medications, including Ritalin. She states that she rarely feels the need to take Xanax. She reports still having bad nightmares. He states her work just moved to a larger location; she states it is and has been a lot of work. She reports some worry about her weight and states she has been eating out every day.  Patient Active Problem List   Diagnosis Date Noted  . PTSD (post-traumatic stress disorder) 09/13/2013  . Depression, reactive 05/24/2013  . ADD (attention deficit disorder) 09/26/2012  . Anxiety state, unspecified 09/26/2012  . BMI 36.0-36.9,adult 09/26/2012   Prior to Admission medications   Medication Sig Start Date End Date Taking? Authorizing Provider  ALPRAZolam Prudy Feeler) 0.5 MG tablet Take 1 tablet (0.5 mg total) by mouth 2 (two) times daily as needed for anxiety. 09/13/13  Yes Melissa Pearson, MD  buPROPion (WELLBUTRIN XL) 150 MG 24 hr tablet Take 3 tablets (450 mg total) by mouth daily. 09/13/13  Yes Melissa Pearson, MD  methylphenidate (RITALIN) 20 MG tablet Take 1 tablet (20 mg total) by mouth 3 (three) times daily. May fill on/after 12/12/13 09/13/13  Yes Melissa Pearson, MD  traMADol (ULTRAM) 50 MG tablet Take 1 tablet (50 mg total) by mouth every 6 (six) hours as needed. 11/20/13   Melissa Hatchet, PA-C     Review of Systems  Psychiatric/Behavioral: Positive for sleep disturbance (nightmares).      Objective:   Physical Exam  Nursing note and vitals reviewed. Constitutional: She is  oriented to person, place, and time. She appears well-developed and well-nourished. No distress.  HENT:  Head: Normocephalic and atraumatic.  Eyes: Conjunctivae and EOM are normal. Pupils are equal, round, and reactive to light.  Neck: Normal range of motion. Neck supple.  Pulmonary/Chest: Effort normal.  Musculoskeletal: Normal range of motion.  Neurological: She is alert and oriented to person, place, and time.  Skin: Skin is warm and dry.  Psychiatric: She has a normal mood and affect.        Assessment & Plan:   I have completed the patient encounter in its entirety as documented by the scribe, with editing by me where necessary. Melissa Davies P. Melissa Davies, M.D. ADD (attention deficit disorder)  Anxiety state, unspecified  Depression, reactive  Meds ordered this encounter  Medications  . methylphenidate (RITALIN) 20 MG tablet    Sig: Take 1 tablet (20 mg total) by mouth 3 (three) times daily. May fill on/after 12/12/13    Dispense:  90 tablet    Refill:  0  . methylphenidate (RITALIN) 20 MG tablet    Sig: Take 1 tablet (20 mg total) by mouth 3 (three) times daily. For 30d after signed    Dispense:  90 tablet    Refill:  0  . methylphenidate (RITALIN) 20 MG tablet    Sig: Take 1 tablet (20 mg total) by mouth 3 (three) times daily. For 60d after signed  Dispense:  90 tablet    Refill:  0  . ALPRAZolam (XANAX) 0.5 MG tablet    Sig: Take 1 tablet (0.5 mg total) by mouth 2 (two) times daily as needed for anxiety.    Dispense:  60 tablet    Refill:  2  . buPROPion (WELLBUTRIN XL) 150 MG 24 hr tablet    Sig: Take 3 tablets (450 mg total) by mouth daily.    Dispense:  90 tablet    Refill:  2   3mos

## 2014-02-26 ENCOUNTER — Other Ambulatory Visit: Payer: Self-pay | Admitting: Internal Medicine

## 2014-02-28 NOTE — Telephone Encounter (Signed)
Chart shows rx rit for 11/9 given 9/9 Also that she'll run out of wellbutr 12./9 And was asked to f/u 3 mos which would be before 12/9

## 2014-03-21 ENCOUNTER — Ambulatory Visit (INDEPENDENT_AMBULATORY_CARE_PROVIDER_SITE_OTHER): Payer: 59 | Admitting: Internal Medicine

## 2014-03-21 ENCOUNTER — Encounter: Payer: Self-pay | Admitting: Internal Medicine

## 2014-03-21 VITALS — BP 125/94 | HR 81 | Temp 98.4°F | Resp 16 | Ht 60.5 in | Wt 199.8 lb

## 2014-03-21 DIAGNOSIS — F341 Dysthymic disorder: Secondary | ICD-10-CM

## 2014-03-21 DIAGNOSIS — F329 Major depressive disorder, single episode, unspecified: Secondary | ICD-10-CM

## 2014-03-21 DIAGNOSIS — F988 Other specified behavioral and emotional disorders with onset usually occurring in childhood and adolescence: Secondary | ICD-10-CM

## 2014-03-21 DIAGNOSIS — F909 Attention-deficit hyperactivity disorder, unspecified type: Secondary | ICD-10-CM

## 2014-03-21 DIAGNOSIS — F4310 Post-traumatic stress disorder, unspecified: Secondary | ICD-10-CM

## 2014-03-21 MED ORDER — BUPROPION HCL ER (XL) 150 MG PO TB24: 450.0000 mg | ORAL_TABLET | Freq: Every day | ORAL | Status: DC

## 2014-03-21 MED ORDER — METHYLPHENIDATE HCL 20 MG PO TABS: 20.0000 mg | ORAL_TABLET | Freq: Three times a day (TID) | ORAL | Status: DC

## 2014-03-21 NOTE — Patient Instructions (Signed)
Therapy: [facility] Marliss CzarAndy Mitchum or Ulice BoldCarson Sarvis (female) [phone]
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
## 2014-05-07 ENCOUNTER — Telehealth: Payer: Self-pay

## 2014-05-07 NOTE — Telephone Encounter (Signed)
PA needed for Ritalin LA 20 mg TID. Spoke to pt who confirmed that she was started on this med and never changed d/t good effectiveness. She has not tried any alternatives. Completed PA on covermymeds. Pending.

## 2014-05-08 MED ORDER — METHYLPHENIDATE HCL ER (OSM) 54 MG PO TBCR: 54.0000 mg | EXTENDED_RELEASE_TABLET | ORAL | Status: DC

## 2014-05-08 NOTE — Telephone Encounter (Signed)
Meds ordered this encounter  Medications  . methylphenidate 54 MG PO CR tablet    Sig: Take 1 tablet (54 mg total) by mouth every morning.    Dispense:  30 tablet    Refill:  0    May dispense brand name if no difference in cost to patient   Call to report whether works as well after 7-14 days

## 2014-05-08 NOTE — Telephone Encounter (Signed)
PA denied bc pt has not tried/failed two of the preferred alternatives: brand or generic Adderall XR, brand or generic Concerta, brand or generic Metadate CD, or vyvanse. Brand names are preferred over generics. PA will still probably be required and quantity will need to be reviewed after medication is approved. Dr Merla Richesoolittle, do you want to Rx one of these?

## 2014-05-08 NOTE — Telephone Encounter (Signed)
Pt notified that this is ready for p/u 

## 2014-05-09 NOTE — Telephone Encounter (Signed)
PA was also needed for methylphenidate. Melissa Davies called ins and completed PA on phone. PA was [facility] which is preferred by ins and will be cheaper for pt than the generic. Notified pharm and advised to fill w/name brand per ins.

## 2014-05-09 NOTE — Telephone Encounter (Signed)
LM w/co -worker to have pt call back to notify.

## 2014-06-06 ENCOUNTER — Telehealth: Payer: Self-pay

## 2014-06-06 NOTE — Telephone Encounter (Signed)
Patient called to inform Dr Merla Richesoolittle that her new medication, Concerta, is not working.  She said the insurance switched her from Ritalin to Concerta.  Please advise.   Thank you.  CB#: [phone]

## 2014-06-07 NOTE — Telephone Encounter (Signed)
Dr. Merla Richesoolittle, the prior authorization may go though this time since she has tried and failed concerta. Did you want to change Rx? If so, I will look out for the prior authorization for this medication. Please advise.

## 2014-06-07 NOTE — Telephone Encounter (Signed)
Spoke with pt, advised message from Dr. Merla Richesoolittle. She has a Rx and will try to send it through so I can complete authorization.

## 2014-06-07 NOTE — Telephone Encounter (Signed)
I would prefer for her to be able to return to Ritalin since she has failed the maximum dose of Concerta

## 2014-06-08 ENCOUNTER — Telehealth: Payer: Self-pay

## 2014-06-08 NOTE — Telephone Encounter (Signed)
I have to see if her pharmacy sent it.

## 2014-06-08 NOTE — Telephone Encounter (Signed)
Pt called again to check on the status of the authorization for her rx-ritalin

## 2014-06-13 ENCOUNTER — Ambulatory Visit (INDEPENDENT_AMBULATORY_CARE_PROVIDER_SITE_OTHER): Payer: 59 | Admitting: Internal Medicine

## 2014-06-13 ENCOUNTER — Encounter: Payer: Self-pay | Admitting: Internal Medicine

## 2014-06-13 VITALS — BP 112/70 | HR 72 | Temp 98.2°F | Resp 16 | Ht 60.5 in | Wt 203.0 lb

## 2014-06-13 DIAGNOSIS — F909 Attention-deficit hyperactivity disorder, unspecified type: Secondary | ICD-10-CM

## 2014-06-13 DIAGNOSIS — F341 Dysthymic disorder: Secondary | ICD-10-CM

## 2014-06-13 DIAGNOSIS — Z23 Encounter for immunization: Secondary | ICD-10-CM

## 2014-06-13 DIAGNOSIS — F988 Other specified behavioral and emotional disorders with onset usually occurring in childhood and adolescence: Secondary | ICD-10-CM

## 2014-06-13 DIAGNOSIS — F329 Major depressive disorder, single episode, unspecified: Secondary | ICD-10-CM

## 2014-06-13 MED ORDER — ALPRAZOLAM 0.5 MG PO TABS: 0.5000 mg | ORAL_TABLET | Freq: Two times a day (BID) | ORAL | Status: DC | PRN

## 2014-06-13 MED ORDER — METHYLPHENIDATE HCL 20 MG PO TABS: 20.0000 mg | ORAL_TABLET | Freq: Three times a day (TID) | ORAL | Status: DC
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
## 2014-06-13 NOTE — Telephone Encounter (Signed)
Called pharmacy to see what is going on with this. The pharmacist staes she will fax this to me again.

## 2014-06-13 NOTE — Patient Instructions (Signed)
No carbs!!! broccoli celery cauliflower carrots anytime!! Meat or fish(nothing fried) plus vege(preferably green) or salad with good dressing(olive oil and vinegar) No sugar beverages walk after eating

## 2014-09-19 ENCOUNTER — Ambulatory Visit: Payer: 59 | Admitting: Internal Medicine

## 2015-01-25 ENCOUNTER — Other Ambulatory Visit: Payer: Self-pay | Admitting: Internal Medicine

## 2015-01-29 NOTE — Telephone Encounter (Signed)
Faxed

## 2015-09-28 ENCOUNTER — Ambulatory Visit (INDEPENDENT_AMBULATORY_CARE_PROVIDER_SITE_OTHER): Payer: Self-pay | Admitting: Internal Medicine

## 2015-09-28 VITALS — BP 124/84 | HR 88 | Temp 98.4°F | Resp 16 | Ht 60.5 in | Wt 215.0 lb

## 2015-09-28 DIAGNOSIS — F988 Other specified behavioral and emotional disorders with onset usually occurring in childhood and adolescence: Secondary | ICD-10-CM

## 2015-09-28 DIAGNOSIS — F909 Attention-deficit hyperactivity disorder, unspecified type: Secondary | ICD-10-CM

## 2015-09-28 DIAGNOSIS — Z6836 Body mass index (BMI) 36.0-36.9, adult: Secondary | ICD-10-CM

## 2015-09-28 DIAGNOSIS — F341 Dysthymic disorder: Secondary | ICD-10-CM

## 2015-09-28 DIAGNOSIS — F329 Major depressive disorder, single episode, unspecified: Secondary | ICD-10-CM

## 2015-09-28 DIAGNOSIS — F4310 Post-traumatic stress disorder, unspecified: Secondary | ICD-10-CM

## 2015-09-28 DIAGNOSIS — F411 Generalized anxiety disorder: Secondary | ICD-10-CM

## 2015-09-28 MED ORDER — METHYLPHENIDATE HCL 20 MG PO TABS: 20.0000 mg | ORAL_TABLET | Freq: Three times a day (TID) | ORAL | Status: DC

## 2015-09-28 MED ORDER — ALPRAZOLAM 0.5 MG PO TABS: 0.5000 mg | ORAL_TABLET | Freq: Two times a day (BID) | ORAL | Status: DC | PRN

## 2015-09-28 MED ORDER — BUPROPION HCL ER (XL) 150 MG PO TB24: 450.0000 mg | ORAL_TABLET | Freq: Every day | ORAL | Status: DC

## 2015-09-28 NOTE — Patient Instructions (Addendum)
Melissa FanningJulie Davies--[facility]  Look at book "PTSD for Dummies"  IF you received an x-ray today, you will receive an invoice from [facility]. Please contact [facility] at [phone] with questions or concerns regarding your invoice.   IF you received labwork today, you will receive an invoice from [facility]. Please contact [facility] at [phone] with questions or concerns regarding your invoice.   Our billing staff will not be able to assist you with questions regarding bills from these companies.  You will be contacted with the lab results as soon as they are available. The fastest way to get your results is to activate your My Chart account. Instructions are located on the last page of this paperwork. If you have not heard from us regarding the results in 2 weeks, please contact this office.     We recommend that you schedule a mammogram for breast cancer screening. Typically, you do not need a referral to do this. Please contact a local imaging center to schedule your mammogram.  [facility] - [phone]  *ask for the Radiology Department [facility]( Imaging) - [phone]) [phone] or [phone]  [facility] - [phone] [facility] - [phone] [facility] - [phone]) [phone]  *ask for the Radiology Department [facility] - [phone]) [phone]  *ask for the Radiology Department [facility] - [phone]) [phone]  *ask for the Mammography Department [facility] - [phone]) [phone]
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
## 2015-11-25 ENCOUNTER — Other Ambulatory Visit: Payer: Self-pay | Admitting: Physician Assistant

## 2015-11-25 ENCOUNTER — Telehealth: Payer: Self-pay

## 2015-11-25 MED ORDER — METHYLPHENIDATE HCL 20 MG PO TABS: 20.0000 mg | ORAL_TABLET | Freq: Three times a day (TID) | ORAL | 0 refills | Status: DC

## 2015-11-25 NOTE — Telephone Encounter (Signed)
Pt brought in medication.  Sarah rewrote rx so patient can get different brand and destroyed medication.

## 2015-11-25 NOTE — Progress Notes (Signed)
Pt presented to clinic for a ritalin replacement Rx - she got her Ritalin from a different pharmacy and the ritalin was from a different manufacturer and it is making her sick and it has done that in the past - she brought her medication bottle to the clinic and the remaining pills were put in the pill destroy container.

## 2015-11-25 NOTE — Telephone Encounter (Signed)
Pt is needing to get the generic brand changed on her adhd medcation she changed phramcies and they gave her a different generic which is making her sick and she is to know what to do next   Best number [phone]

## 2016-05-03 ENCOUNTER — Emergency Department (HOSPITAL_COMMUNITY)
Admission: EM | Admit: 2016-05-03 | Discharge: 2016-05-03 | Disposition: A | Discharge status: Home / Self Care | Payer: Self-pay | Attending: Emergency Medicine | Admitting: Emergency Medicine

## 2016-05-03 ENCOUNTER — Encounter (HOSPITAL_COMMUNITY): Payer: Self-pay | Admitting: Emergency Medicine

## 2016-05-03 ENCOUNTER — Emergency Department (HOSPITAL_COMMUNITY): Payer: Self-pay

## 2016-05-03 DIAGNOSIS — F909 Attention-deficit hyperactivity disorder, unspecified type: Secondary | ICD-10-CM | POA: Insufficient documentation

## 2016-05-03 DIAGNOSIS — Z79899 Other long term (current) drug therapy: Secondary | ICD-10-CM | POA: Insufficient documentation

## 2016-05-03 DIAGNOSIS — Y929 Unspecified place or not applicable: Secondary | ICD-10-CM | POA: Insufficient documentation

## 2016-05-03 DIAGNOSIS — W108XXA Fall (on) (from) other stairs and steps, initial encounter: Secondary | ICD-10-CM | POA: Insufficient documentation

## 2016-05-03 DIAGNOSIS — S060X9A Concussion with loss of consciousness of unspecified duration, initial encounter: Secondary | ICD-10-CM | POA: Insufficient documentation

## 2016-05-03 DIAGNOSIS — Z87891 Personal history of nicotine dependence: Secondary | ICD-10-CM | POA: Insufficient documentation

## 2016-05-03 DIAGNOSIS — R519 Headache, unspecified: Secondary | ICD-10-CM

## 2016-05-03 DIAGNOSIS — Y999 Unspecified external cause status: Secondary | ICD-10-CM | POA: Insufficient documentation

## 2016-05-03 DIAGNOSIS — R51 Headache: Secondary | ICD-10-CM

## 2016-05-03 DIAGNOSIS — Y939 Activity, unspecified: Secondary | ICD-10-CM | POA: Insufficient documentation

## 2016-05-03 DIAGNOSIS — S0990XA Unspecified injury of head, initial encounter: Secondary | ICD-10-CM

## 2016-05-03 MED ORDER — DIPHENHYDRAMINE HCL 50 MG/ML IJ SOLN
25.0000 mg | Freq: Once | INTRAMUSCULAR | Status: AC
  Administered 2016-05-03: 25 mg via INTRAVENOUS
  Filled 2016-05-03: qty 1

## 2016-05-03 MED ORDER — DEXAMETHASONE SODIUM PHOSPHATE 10 MG/ML IJ SOLN
10.0000 mg | Freq: Once | INTRAMUSCULAR | Status: AC
  Administered 2016-05-03: 10 mg via INTRAVENOUS
  Filled 2016-05-03: qty 1

## 2016-05-03 MED ORDER — SODIUM CHLORIDE 0.9 % IV BOLUS (SEPSIS)
1000.0000 mL | Freq: Once | INTRAVENOUS | Status: AC
  Administered 2016-05-03: 1000 mL via INTRAVENOUS

## 2016-05-03 MED ORDER — PROCHLORPERAZINE EDISYLATE 5 MG/ML IJ SOLN
10.0000 mg | Freq: Once | INTRAMUSCULAR | Status: AC
  Administered 2016-05-03: 10 mg via INTRAVENOUS
  Filled 2016-05-03: qty 2

## 2016-05-03 NOTE — ED Provider Notes (Signed)
[facility]-EMERGENCY DEPT Provider Note   CSN: [id number] Arrival date & time: 05/03/16  0751     History   Chief Complaint Chief Complaint  Patient presents with  . Dizziness  . Headache    HPI Melissa Davies is a 46 y.o. female with a past medical history significant for anxiety and a remote history of migraines who presents with a fall with subsequent headache, dizziness, and coordination problems. Patient reports that 2 weeks ago, she had a fall down a flight of stairs striking her head and face on the ground. She is unsure of loss of consciousness but was found by her husband on the ground. Patient reports that she has not been evaluated by a medical team since then as she was waiting to feel better. She says that she is continuing to have worsening 10 out of 10 headache on the left side of her head. She describes a intermittent spinning sensation that is worsened with movement. She says that when she closes her eyes she continues to spend. She reports nausea but no vomiting. She denies any vision changes. She reports that she had some bruising and a small laceration on her left forehead that is healed. She denies difficulty with her arms or legs. She says that intermittently she has difficulty with walking.   She reports that her visit today was prompted because she had a collapsing event from her coordination last night. She says this has happened a total of 3 times this week. She denies striking her head during any of these falls. She reports she has not taken aspirin and denies any other symptoms. She denies any chest pain, shortness breath, cough, fevers, chills, loss of bowel or bladder function.    HPI  Past Medical History:  Diagnosis Date  . Anxiety     Patient Active Problem List   Diagnosis Date Noted  . PTSD (post-traumatic stress disorder) 09/13/2013  . Depression, reactive 05/24/2013  . ADD (attention deficit disorder) 09/26/2012  . Anxiety state 09/26/2012  . BMI  36.0-36.9,adult 09/26/2012    Past Surgical History:  Procedure Laterality Date  . TUBAL LIGATION      OB History    No data available       Home Medications    Prior to Admission medications   Medication Sig Start Date End Date Taking? Authorizing Provider  ALPRAZolam Prudy Feeler) 0.5 MG tablet Take 1 tablet (0.5 mg total) by mouth 2 (two) times daily as needed. for anxiety 09/28/15   Tonye Pearson, MD  buPROPion (WELLBUTRIN XL) 150 MG 24 hr tablet Take 3 tablets (450 mg total) by mouth daily. 09/28/15   Tonye Pearson, MD  methylphenidate (RITALIN) 20 MG tablet Take 1 tablet (20 mg total) by mouth 3 (three) times daily. For 60d after signed 09/28/15   Tonye Pearson, MD  methylphenidate (RITALIN) 20 MG tablet Take 1 tablet (20 mg total) by mouth 3 (three) times daily. For 30d after signed 09/28/15   Tonye Pearson, MD  methylphenidate (RITALIN) 20 MG tablet Take 1 tablet (20 mg total) by mouth 3 (three) times daily. 11/25/15   Morrell Riddle, PA-C  traMADol (ULTRAM) 50 MG tablet Take 1 tablet (50 mg total) by mouth every 6 (six) hours as needed. Patient not taking: Reported on 03/21/2014 11/20/13   Garlon Hatchet, PA-C    Family History Family History  Problem Relation Age of Onset  . ADD / ADHD Son     Social History  Social History  Substance Use Topics  . Smoking status: Former Games developer  . Smokeless tobacco: Never Used  . Alcohol use Yes     Comment: occasional      Allergies   Klonopin [clonazepam]   Review of Systems Review of Systems  Constitutional: Negative for activity change, chills, diaphoresis, fatigue and fever.  HENT: Negative for congestion and rhinorrhea.   Eyes: Negative for visual disturbance.  Respiratory: Negative for cough, chest tightness, shortness of breath and stridor.   Cardiovascular: Negative for chest pain, palpitations and leg swelling.  Gastrointestinal: Negative for abdominal distention, abdominal pain, constipation, diarrhea,  nausea and vomiting.  Genitourinary: Negative for difficulty urinating, dysuria, flank pain, frequency, hematuria, menstrual problem, pelvic pain, vaginal bleeding and vaginal discharge.  Musculoskeletal: Negative for back pain and neck pain.  Skin: Positive for wound (Healing for head laceration). Negative for rash.  Neurological: Positive for dizziness, light-headedness and headaches. Negative for syncope, weakness and numbness.  Psychiatric/Behavioral: Negative for agitation and confusion.  All other systems reviewed and are negative.    Physical Exam Updated Vital Signs BP 122/57 (BP Location: Right Arm)   Pulse 77   Temp 97.4 F (36.3 C) (Oral)   Resp 18   SpO2 100%   Physical Exam  Constitutional: She is oriented to person, place, and time. She appears well-developed and well-nourished. No distress.  HENT:  Head: Head is with laceration.    Right Ear: External ear normal.  Left Ear: External ear normal.  Nose: Nose normal.  Mouth/Throat: Oropharynx is clear and moist. No oropharyngeal exudate.  Eyes: Conjunctivae and EOM are normal. Pupils are equal, round, and reactive to light.  Neck: Normal range of motion. Neck supple.  Pulmonary/Chest: No stridor. No respiratory distress.  Abdominal: She exhibits no distension. There is no tenderness. There is no rebound.  Musculoskeletal: She exhibits no tenderness.  Neurological: She is alert and oriented to person, place, and time. She has normal reflexes. She displays no atrophy, no tremor and normal reflexes. No cranial nerve deficit or sensory deficit. She exhibits normal muscle tone. She displays no seizure activity. Coordination and gait normal. GCS eye subscore is 4. GCS verbal subscore is 5. GCS motor subscore is 6.  Skin: Skin is warm. No rash noted. She is not diaphoretic. No erythema.  Nursing note and vitals reviewed.    ED Treatments / Results  Labs (all labs ordered are listed, but only abnormal results are  displayed) Labs Reviewed - No data to display  EKG  EKG Interpretation None       Radiology Ct Head Wo Contrast  Result Date: 05/03/2016 CLINICAL DATA:  Fall.  Dizziness EXAM: CT HEAD WITHOUT CONTRAST CT MAXILLOFACIAL WITHOUT CONTRAST TECHNIQUE: Multidetector CT imaging of the head and maxillofacial structures were performed using the standard protocol without intravenous contrast. Multiplanar CT image reconstructions of the maxillofacial structures were also generated. COMPARISON:  None. FINDINGS: CT HEAD FINDINGS Brain: No evidence of acute infarction, hemorrhage, hydrocephalus, extra-axial collection or mass lesion/mass effect. Vascular: No hyperdense vessel or unexpected calcification. Skull: Normal. Negative for fracture or focal lesion. Other: None. CT MAXILLOFACIAL FINDINGS Osseous: No fracture or mandibular dislocation. No destructive process. Orbits: Negative. No traumatic or inflammatory finding. Sinuses: Clear. Soft tissues: Negative. IMPRESSION: 1. No acute intracranial abnormality. 2. No evidence for facial bone fracture. Electronically Signed   By: Signa Kell M.D.   On: 05/03/2016 09:37   Ct Cervical Spine Wo Contrast  Result Date: 05/03/2016 CLINICAL DATA:  Fall.  Neck pain. EXAM: CT CERVICAL SPINE WITHOUT CONTRAST TECHNIQUE: Multidetector CT imaging of the cervical spine was performed without intravenous contrast. Multiplanar CT image reconstructions were also generated. COMPARISON:  None. FINDINGS: Alignment: Normal. Skull base and vertebrae: No acute fracture. No primary bone lesion or focal pathologic process. Soft tissues and spinal canal: No prevertebral fluid or swelling. No visible canal hematoma. Disc levels: Disc space narrowing and ventral endplate spurring is identified at C6-7. Upper chest: Negative. Other: None. IMPRESSION: 1. No acute findings. 2. Degenerative disc disease at C6-7. Electronically Signed   By: Signa Kellaylor  Stroud M.D.   On: 05/03/2016 09:49   Ct  Maxillofacial Wo Contrast  Result Date: 05/03/2016 CLINICAL DATA:  Fall.  Dizziness EXAM: CT HEAD WITHOUT CONTRAST CT MAXILLOFACIAL WITHOUT CONTRAST TECHNIQUE: Multidetector CT imaging of the head and maxillofacial structures were performed using the standard protocol without intravenous contrast. Multiplanar CT image reconstructions of the maxillofacial structures were also generated. COMPARISON:  None. FINDINGS: CT HEAD FINDINGS Brain: No evidence of acute infarction, hemorrhage, hydrocephalus, extra-axial collection or mass lesion/mass effect. Vascular: No hyperdense vessel or unexpected calcification. Skull: Normal. Negative for fracture or focal lesion. Other: None. CT MAXILLOFACIAL FINDINGS Osseous: No fracture or mandibular dislocation. No destructive process. Orbits: Negative. No traumatic or inflammatory finding. Sinuses: Clear. Soft tissues: Negative. IMPRESSION: 1. No acute intracranial abnormality. 2. No evidence for facial bone fracture. Electronically Signed   By: Signa Kellaylor  Stroud M.D.   On: 05/03/2016 09:37    Procedures Procedures (including critical care time)  Medications Ordered in ED Medications  sodium chloride 0.9 % bolus 1,000 mL (0 mLs Intravenous Stopped 05/03/16 1138)  prochlorperazine (COMPAZINE) injection 10 mg (10 mg Intravenous Given 05/03/16 1013)  diphenhydrAMINE (BENADRYL) injection 25 mg (25 mg Intravenous Given 05/03/16 1012)  dexamethasone (DECADRON) injection 10 mg (10 mg Intravenous Given 05/03/16 1013)     Initial Impression / Assessment and Plan / ED Course  I have reviewed the triage vital signs and the nursing notes.  Pertinent labs & imaging results that were available during my care of the patient were reviewed by me and considered in my medical decision making (see chart for details).     Melissa Davies is a 46 y.o. female with a past medical history significant for anxiety and a remote history of migraines who presents with a fall with subsequent  headache, dizziness, and coordination problems.   history and exam are seen above. On exam, patient has a small healing laceration to her left eyebrow. Patient showed pictures of bruising on the face and lip and head. This has improved. Patient has normal sensation in all extremities interface. No facial droop. Normal extraocular movement. Patient had normal coordination on exam with normal gait and negative Romberg. Patient says that her dizziness and correlation problems are intermittent in nature. Lungs were clear and abdomen was nontender.  Given the complaints, patient will have a CT of the head, face, and neck to look for traumatic injuries. Negative, patient will receive a headache cocktail to try improve her symptoms.  CT imaging results are seen above. Patient had no evidence of acute traumatic injury of the head, neck, or face.  Patient reported resolution of her headache after headache cocktail including Compazine, Benadryl, fluids, and Decadron. Patient also reports resolution of her dizziness and spinning sensation.  Suspect patient is having postconcussive type symptoms. Given reassuring imaging, doubt fracture or bleed. Patient instructed to follow-up with a primary care physician for further management of her traumatic injury  and concussion. Patient given return precautions for any new or worsening symptoms.   Patient had no other questions or concerns and patient was discharged in good condition with his significant improvement in her presenting symptoms.    Final Clinical Impressions(s) / ED Diagnoses   Final diagnoses:  Bad headache  Injury of head, initial encounter  Concussion with loss of consciousness, initial encounter    New Prescriptions New Prescriptions   No medications on file    Clinical Impression: 1. Bad headache   2. Injury of head, initial encounter   3. Concussion with loss of consciousness, initial encounter     Disposition:  Discharge  Condition: Good  I have discussed the results, Dx and Tx plan with the pt(& family if present). He/she/they expressed understanding and agree(s) with the plan. Discharge instructions discussed at great length. Strict return precautions discussed and pt &/or family have verbalized understanding of the instructions. No further questions at time of discharge.    New Prescriptions   No medications on file    Follow Up: Ethelda Chick, MD [address] [place] [address] [phone]     [facility] [id number] [place] [address] [phone]  If symptoms worsen     Heide Scales, MD 05/04/16 1040

## 2016-05-03 NOTE — Discharge Instructions (Signed)
Please stay hydrated. Please rest. Please follow-up with a primary physician for further observation of your head injury. If any symptoms worsen or develop, please return to the nearest emergency department.

## 2016-05-03 NOTE — ED Triage Notes (Addendum)
Patient fell down stairs on Jan 8th.  Patient reports that since the fall she is dizzy esp when standing and having headache. Patient states that she wasn't seen after fall because she thought she would feel better.

## 2016-06-02 ENCOUNTER — Ambulatory Visit: Payer: Self-pay | Admitting: Physician Assistant

## 2016-06-02 ENCOUNTER — Encounter: Payer: Self-pay | Admitting: Physician Assistant

## 2016-06-02 ENCOUNTER — Ambulatory Visit (INDEPENDENT_AMBULATORY_CARE_PROVIDER_SITE_OTHER): Payer: Self-pay | Admitting: Physician Assistant

## 2016-06-02 VITALS — BP 109/71 | HR 74 | Temp 97.7°F | Ht 60.05 in | Wt 208.4 lb

## 2016-06-02 DIAGNOSIS — G4700 Insomnia, unspecified: Secondary | ICD-10-CM

## 2016-06-02 DIAGNOSIS — F902 Attention-deficit hyperactivity disorder, combined type: Secondary | ICD-10-CM

## 2016-06-02 DIAGNOSIS — F329 Major depressive disorder, single episode, unspecified: Secondary | ICD-10-CM

## 2016-06-02 DIAGNOSIS — F411 Generalized anxiety disorder: Secondary | ICD-10-CM

## 2016-06-02 DIAGNOSIS — F4310 Post-traumatic stress disorder, unspecified: Secondary | ICD-10-CM

## 2016-06-02 MED ORDER — METHYLPHENIDATE HCL 20 MG PO TABS: ORAL_TABLET | ORAL | 0 refills | Status: DC

## 2016-06-02 MED ORDER — ALPRAZOLAM 0.5 MG PO TABS: 0.5000 mg | ORAL_TABLET | Freq: Two times a day (BID) | ORAL | 0 refills | Status: DC | PRN

## 2016-06-02 MED ORDER — BUPROPION HCL ER (XL) 150 MG PO TB24: 450.0000 mg | ORAL_TABLET | Freq: Every day | ORAL | 0 refills | Status: DC

## 2016-06-02 MED ORDER — VENLAFAXINE HCL ER 37.5 MG PO CP24: 37.5000 mg | ORAL_CAPSULE | Freq: Every day | ORAL | 1 refills | Status: DC

## 2016-06-02 NOTE — Patient Instructions (Addendum)
Black cohosh, soy products (but not to much) Increase your exercise Fans and cotton nightwear for hot flashes at night    IF you received an x-ray today, you will receive an invoice from [facility]. Please contact [facility] at [phone] with questions or concerns regarding your invoice.   IF you received labwork today, you will receive an invoice from [facility]. Please contact [facility] at [phone] with questions or concerns regarding your invoice.   Our billing staff will not be able to assist you with questions regarding bills from these companies.  You will be contacted with the lab results as soon as they are available. The fastest way to get your results is to activate your My Chart account. Instructions are located on the last page of this paperwork. If you have not heard from us regarding the results in 2 weeks, please contact this office.

## 2016-06-02 NOTE — Progress Notes (Signed)
Melissa BarrierRoberta G Davies  MRN: [id number] DOB: 02/11/1971  Subjective:  Pt presents to clinic for medication refills.  She has recently switch her care to me as her PCP retired.    She had a concussion in Jan and was seen in the ED on 1/21 for symptoms - she still has daily headache and is able to do more activity each day.  Her dizziness is also improving - it was really hard mentally during the concussion because she could not work and could not do anything.   She is not sleeping at night because of hot flashes.  She has tried nothing OTC to help with her symptoms.  She feels like her irritability is really high and husband who is with her today agrees that she is irritable.  He finds that her irritability is worse in the afternoon - she states that she mainly takes her ritalin in the am - she does not remember to take it in the afternoon.  She feels like she has overall been stable on the Wellbutrin and ritalin regimen.  She has been on Effexor in the past but she felt like it caused weight gain.  Her anxiety is getting worse with her irritability and she is starting to have some physical symptoms like SOB this seems to be worse since her concussion and her inability to do activities that she once liked to do.    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  Cardiovascular: Negative for chest pain and palpitations.  Psychiatric/Behavioral: Positive for decreased concentration. The patient is nervous/anxious and is hyperactive.     Patient Active Problem List   Diagnosis Date Noted  . PTSD (post-traumatic stress disorder) 09/13/2013  . Depression, reactive 05/24/2013  . ADD (attention deficit disorder) 09/26/2012  . Anxiety state 09/26/2012  . BMI 36.0-36.9,adult 09/26/2012    Current Outpatient Prescriptions on File Prior to Visit  Medication Sig Dispense Refill  .  methylphenidate (RITALIN) 20 MG tablet Take 1 tablet (20 mg total) by mouth 3 (three) times daily. For 30d after signed (Patient not taking: Reported on 06/02/2016) 90 tablet 0  . methylphenidate (RITALIN) 20 MG tablet Take 1 tablet (20 mg total) by mouth 3 (three) times daily. (Patient not taking: Reported on 06/02/2016) 90 tablet 0   No current facility-administered medications on file prior to visit.     Allergies  Allergen Reactions  . Klonopin [Clonazepam] Rash    On face    Pt patients past, family and social history were reviewed and updated.   Objective:  BP 109/71 (BP Location: Right Arm, Patient Position: Sitting, Cuff Size: Small)   Pulse 74   Temp 97.7 F (36.5 C) (Oral)   Ht 5' 0.05" (1.525 m)   Wt 208 lb 6.4 oz (94.5 kg)   LMP 05/11/2016 (Approximate)   SpO2 98%   BMI 40.63 kg/m   Physical Exam  Constitutional: She is oriented to person, place, and time and well-developed, well-nourished, and in no distress.  HENT:  Head: Normocephalic and atraumatic.  Right Ear: Hearing and external ear normal.  Left Ear: Hearing and external ear normal.  Eyes: Conjunctivae are normal.  Neck: Normal range of motion.  Cardiovascular: Normal rate, regular rhythm and normal heart sounds.   No murmur heard. Pulmonary/Chest: Effort normal and breath sounds normal. She has no wheezes.  Neurological: She is alert and oriented to person, place,  and time. Gait normal.  Skin: Skin is warm and dry.  Psychiatric: Mood, memory, affect and judgment normal.  Pt is hyperactive in room, talking very fast, very animated  Vitals reviewed.  Spent 30 mins with the patient - greater than 50% of the visit was face to face counseling   Assessment and Plan :  Attention deficit hyperactivity disorder (ADHD), combined type - Plan: methylphenidate (RITALIN) 20 MG tablet - continue current dose but encouraged patient to really take the medication bid as this will allow [place] to see whether her lack of focu  is causing her increased afternoon irritability - if this helps we will switch to extended release as the patient does not like bid dosing of medication but a single dose appears to not adequately treat her symptoms.  Depression, reactive - Plan: buPROPion (WELLBUTRIN XL) 150 MG 24 hr tablet - continue medication - she feels like this has helped her depression and given her some energy and motivation  PTSD (post-traumatic stress disorder)  Anxiety state - Plan: venlafaxine XR (EFFEXOR XR) 37.5 MG 24 hr capsule, ALPRAZolam (XANAX) 0.5 MG tablet - pt currently uses Xanax 2-4 times a week for sleep - she does not like to use this medication - we will treat her perimenopausal irritability and vasomotor symptoms with Effexor and recheck her in a month - she has had trouble with this is the past - we did discussed gabapentin but I think this is a better alternative to her currently  Insomnia, unspecified type  Benny Lennert PA-C  Primary Care at [facility] 06/04/2016 5:37 PM

## 2016-06-04 ENCOUNTER — Encounter: Payer: Self-pay | Admitting: Physician Assistant

## 2016-06-30 ENCOUNTER — Ambulatory Visit: Payer: Self-pay | Admitting: Physician Assistant

## 2016-08-20 ENCOUNTER — Telehealth: Payer: Self-pay | Admitting: Physician Assistant

## 2016-08-20 NOTE — Telephone Encounter (Signed)
PATIENT STATES DR. Merla RichesOLITTLE USE TO BE HER PCP AND HE WOULD GIVE HER 3 MONTHS OF HER RITALIN 20 MG. SHE WAS ASSIGNED TO SARAH AND SHE ONLY GAVE HER A 1 MONTH SUPPLY. SHE SAID SHE DOES NOT HAVE INSURANCE AND SHE CAN NOT AFFORD ANOTHER OFFICE VISIT. SHE WOULD LIKE TO GET IT REFILLED. BEST PHONE [phone] (CELL) MBC

## 2016-08-21 NOTE — Telephone Encounter (Signed)
05/2016 last ov 

## 2016-08-22 NOTE — Telephone Encounter (Signed)
At her last visit we had a long discussion with medication changes and additions so the plan was in Feb that I would see her in a month that is why I only gave her a 1 month supply.  She needs an OV.

## 2016-08-24 NOTE — Telephone Encounter (Signed)
l/m with sarahs message

## 2016-08-25 ENCOUNTER — Ambulatory Visit (INDEPENDENT_AMBULATORY_CARE_PROVIDER_SITE_OTHER): Payer: Self-pay | Admitting: Family Medicine

## 2016-08-25 ENCOUNTER — Encounter: Payer: Self-pay | Admitting: Family Medicine

## 2016-08-25 VITALS — BP 108/54 | HR 70 | Temp 98.2°F | Resp 16 | Ht 60.05 in | Wt 203.4 lb

## 2016-08-25 DIAGNOSIS — F411 Generalized anxiety disorder: Secondary | ICD-10-CM

## 2016-08-25 DIAGNOSIS — F329 Major depressive disorder, single episode, unspecified: Secondary | ICD-10-CM

## 2016-08-25 DIAGNOSIS — F902 Attention-deficit hyperactivity disorder, combined type: Secondary | ICD-10-CM

## 2016-08-25 NOTE — Patient Instructions (Addendum)
[facility] Address: [address], [place], [place] [address]  Hours:  Open ? Closes 5PM  Phone: [phone]     IF you received an x-ray today, you will receive an invoice from [facility]. Please contact [facility] at [phone] with questions or concerns regarding your invoice.   IF you received labwork today, you will receive an invoice from [facility]. Please contact [facility] at [phone] with questions or concerns regarding your invoice.   Our billing staff will not be able to assist you with questions regarding bills from these companies.  You will be contacted with the lab results as soon as they are available. The fastest way to get your results is to activate your My Chart account. Instructions are located on the last page of this paperwork. If you have not heard from us regarding the results in 2 weeks, please contact this office.      Attention Deficit Hyperactivity Disorder, Pediatric Attention deficit hyperactivity disorder (ADHD) is a condition that can make it hard for a child to pay attention and concentrate or to control his or her behavior. The child may also have a lot of energy. ADHD is a disorder of the brain (neurodevelopmental disorder), and symptoms are typically first seen in early childhood. It is a common reason for behavioral and academic problems in school. There are three main types of ADHD:  Inattentive. With this type, children have difficulty paying attention.  Hyperactive-impulsive. With this type, children have a lot of energy and have difficulty controlling their behavior.  Combination. This type involves having symptoms of both of the other types. ADHD is a lifelong condition. If it is not treated, the disorder can affect a child's future academic achievement, employment, and relationships. What are the causes? The exact cause of this condition is not known. What increases the risk? This condition is more  likely to develop in:  Children who have a first-degree relative, such as a parent or brother or sister, with the condition.  Children who had a low birth weight.  Children whose mothers had problems during pregnancy or used alcohol or tobacco during pregnancy.  Children who have had a brain infection or a head injury.  Children who have been exposed to lead. What are the signs or symptoms? Symptoms of this condition depend on the type of ADHD. Symptoms are listed here for each type: Inattentive   Problems with organization.  Difficulty staying focused.  Problems completing assignments at school.  Often making simple mistakes.  Problems sustaining mental effort.  Not listening to instructions.  Losing things often.  Forgetting things often.  Being easily distracted. Hyperactive-impulsive   Fidgeting often.  Difficulty sitting still in one's seat.  Talking a lot.  Talking out of turn.  Interrupting others.  Difficulty relaxing or doing quiet activities.  High energy levels and constant movement.  Difficulty waiting.  Always "on the go." Combination   Having symptoms of both of the other types. Children with ADHD may feel frustrated with themselves and may find school to be particularly discouraging. They often perform below their abilities in school. As children get older, the excess movement can lessen, but the problems with paying attention and staying organized often continue. Most children do not outgrow ADHD, but with good treatment, they can learn to cope with the symptoms. How is this diagnosed? This condition is diagnosed based on a child's symptoms and academic history. The child's health care provider will do a complete assessment. As part  of the assessment, the health care provider will ask the child questions and will ask the parents and teachers for their observations of the child. The health care provider looks for specific symptoms of  ADHD. Diagnosis will include:  Ruling out other reasons for the child's behavior.  Reviewing behavior rating scales that have been filled out about the child by people who deal with the child on a daily basis. A diagnosis is made only after all information from multiple people has been considered. How is this treated? Treatment for this condition may include:  Behavior therapy.  Medicines to decrease impulsivity and hyperactivity and to increase attention. Behavior therapy is preferred for children younger than 96 years old. The combination of medicine and behavior therapy is most effective for children older than 7 years of age.  Tutoring or extra support at school.  Techniques for parents to use at home to help manage their child's symptoms and behavior. Follow these instructions at home: Eating and drinking   Offer your child a well-balanced diet. Breakfast that includes a balance of whole grains, protein, and fruits or vegetables is especially important for school performance.  If your child has trouble with hyperactivity, have your child avoid drinks that contain caffeine. These include:  Soft drinks.  Coffee.  Tea.  If your child is older and finds that caffeinated drinks help to improve his or her attention, talk with your child's health care provider about what amount of caffeine intake is a safe for your child. Lifestyle    Make sure your child gets a full night of sleep and regular daily exercise.  Help manage your child's behavior by following the techniques learned in therapy. These may include:  Looking for good behavior and rewarding it.  Making rules for behavior that your child can understand and follow.  Giving clear instructions.  Responding consistently to your child's challenging behaviors.  Setting realistic goals.  Looking for activities that can lead to success and self-esteem.  Making time for pleasant activities with your child.  Giving lots  of affection.  Help your child learn to be organized. Some ways to do this include:  Keeping daily schedules the same. Have a regular wake-up time and bedtime for your child. Schedule all activities, including time for homework and time for play. Post the schedule in a place where your child will see it. Mark schedule changes in advance.  Having a regular place for your child to store items such as clothing, backpacks, and school supplies.  Encouraging your child to write down school assignments and to bring home needed books. Work with your child's teachers for assistance in organizing school work. General instructions   Learn as much as you can about ADHD. This will improve your ability to help your child and to make sure he or she gets the support needed. It will also help you educate your child's teachers and instructors if they do not feel that they have adequate knowledge or experience in these areas.  Work with your child's teachers to make sure your child gets the support and extra help that is needed. This may include:  Tutoring.  Teacher cues to help your child remain on task.  Seating changes so your child is working at a desk that is free from distractions.  Give over-the-counter and prescription medicines only as told by your child's health care provider.  Keep all follow-up visits as told by your health care provider. This is important. Contact a health care  provider if:  Your child has repeated muscle twitches (tics), coughs, or speech outbursts.  Your child has sleep problems.  Your child has a marked loss of appetite.  Your child develops depression.  Your child has new or worsening behavioral problems.  Your child has dizziness.  Your child has a racing heart.  Your child has stomach pains.  Your child develops headaches. Get help right away if:  Your child talks about or threatens suicide.  You are worried that your child is having a bad reaction to a  medicine that he or she is taking for ADHD. This information is not intended to replace advice given to you by your health care provider. Make sure you discuss any questions you have with your health care provider. Document Released: 03/20/2002 Document Revised: 11/27/2015 Document Reviewed: 10/24/2015 Elsevier Interactive Patient Education  2017 [employer].

## 2016-08-25 NOTE — Progress Notes (Signed)
Chief Complaint  Patient presents with  . Medication Refill    Xanax, Ritalin, wellbutrin xl    HPI  Anxiety Patient reports that she takes Xanax as needed if she is trying to go to sleep at night She reports that she that on average she was out of her xanax  She would take xanax every night to go to sleep She would take it initially for sleep and anxiety She denies history of seizure  ADHD She takes Ritalin She takes it daily for the past  She states that she has been diagnosed for over 5-8 years She does not drink caffeine regularly She took her Ritalin over 30 days She states that she has been without ritalin for 30 years  Depression She takes wellbutrin for depression daily She has never hospitalized for depression She was started with depression treatment for 10 years after being divorced 10 years and has not been able to get over it She denies SI She reports that was started on effexor for menopause She states that it caused weight gain so she stopped effexor She says that she thinks that she is going to drink more beer to help stabilize her mood. She has not had a beer in over a month.  She admits that if she has depression she drinks 2 beers and is back to normal.   Depression screen [facility] 2/9 08/25/2016 06/02/2016 09/28/2015 07/12/2013  Decreased Interest 0 0 3 0  Down, Depressed, Hopeless 0 3 3 0  PHQ - 2 Score 0 3 6 0  Altered sleeping - 3 3 -  Tired, decreased energy - 3 3 -  Change in appetite - 3 3 -  Feeling bad or failure about yourself  - 3 3 -  Trouble concentrating - 3 3 -  Moving slowly or fidgety/restless - 3 2 -  Suicidal thoughts - 0 0 -  PHQ-9 Score - 21 23 -  Difficult doing work/chores - Extremely dIfficult - -     Past Medical History:  Diagnosis Date  . Anxiety     Current Outpatient Prescriptions  Medication Sig Dispense Refill  . ALPRAZolam (XANAX) 0.5 MG tablet Take 1 tablet (0.5 mg total) by mouth 2 (two) times daily as needed. for  anxiety 20 tablet 0  . buPROPion (WELLBUTRIN XL) 150 MG 24 hr tablet Take 3 tablets (450 mg total) by mouth daily. 90 tablet 0  . methylphenidate (RITALIN) 20 MG tablet 40mg  qam then 1 po q afternoon 90 tablet 0  . methylphenidate (RITALIN) 20 MG tablet Take 1 tablet (20 mg total) by mouth 3 (three) times daily. For 30d after signed (Patient not taking: Reported on 06/02/2016) 90 tablet 0  . methylphenidate (RITALIN) 20 MG tablet Take 1 tablet (20 mg total) by mouth 3 (three) times daily. (Patient not taking: Reported on 06/02/2016) 90 tablet 0  . venlafaxine XR (EFFEXOR XR) 37.5 MG 24 hr capsule Take 1 capsule (37.5 mg total) by mouth daily with breakfast. (Patient not taking: Reported on 08/25/2016) 30 capsule 1   No current facility-administered medications for this visit.     Allergies:  Allergies  Allergen Reactions  . Klonopin [Clonazepam] Rash    On face    Past Surgical History:  Procedure Laterality Date  . TUBAL LIGATION      Social History   Social History  . Marital status: Significant Other    Spouse name: N/A  . Number of children: N/A  . Years of education: N/A  Occupational History  . alterations    Social History Main Topics  . Smoking status: Former Games developermoker  . Smokeless tobacco: Never Used  . Alcohol use Yes     Comment: occasional   . Drug use: No  . Sexual activity: Yes    Partners: Male   Other Topics Concern  . None   Social History Narrative   Married   Children -   Covers with clothing alterations and upholestry       Review of Systems  Constitutional: Negative for chills and fever.  Respiratory: Negative for cough, hemoptysis, shortness of breath and wheezing.   Cardiovascular: Negative for chest pain and palpitations.  Neurological: Negative for dizziness and headaches.  Psychiatric/Behavioral: Positive for depression. The patient is nervous/anxious.     Objective: Vitals:   08/25/16 0909  BP: (!) 108/54  Pulse: 70  Resp: 16    Temp: 98.2 F (36.8 C)  TempSrc: Oral  SpO2: 100%  Weight: 203 lb 6.4 oz (92.3 kg)  Height: 5' 0.05" (1.525 m)    Physical Exam  Constitutional: She is oriented to person, place, and time. She appears well-developed and well-nourished.  HENT:  Head: Normocephalic and atraumatic.  Eyes: Conjunctivae and EOM are normal.  Cardiovascular: Normal rate, regular rhythm and normal heart sounds.   Pulmonary/Chest: Effort normal and breath sounds normal. No respiratory distress. She has no wheezes.  Neurological: She is alert and oriented to person, place, and time.  Psychiatric:  Labile mood with tearfulness    Assessment and Plan [name] was seen today for medication refill.  Diagnoses and all orders for this visit:  Attention deficit hyperactivity disorder (ADHD), combined type-  Advised pt to go back to [facility] Will not refill Ritalin that she should get with Psychiatry for a mood stabilizer  Depression, reactive- continue Wellbutrin Discussed that she should re-established with Psychiatry  Anxiety state- will not refill Xanax today Discussed that the stimulant could be affecting her anxiety state     Roshni Burbano A Creta LevinStallings

## 2016-08-27 ENCOUNTER — Ambulatory Visit: Payer: Self-pay | Admitting: Physician Assistant

## 2019-07-21 ENCOUNTER — Emergency Department (HOSPITAL_COMMUNITY)
Admission: EM | Admit: 2019-07-21 | Discharge: 2019-07-22 | Disposition: A | Discharge status: Other Acute Inpatient Hospital | Payer: Self-pay | Attending: Emergency Medicine | Admitting: Emergency Medicine

## 2019-07-21 ENCOUNTER — Other Ambulatory Visit: Payer: Self-pay

## 2019-07-21 ENCOUNTER — Encounter (HOSPITAL_COMMUNITY): Payer: Self-pay

## 2019-07-21 DIAGNOSIS — Z87891 Personal history of nicotine dependence: Secondary | ICD-10-CM | POA: Insufficient documentation

## 2019-07-21 DIAGNOSIS — Z79899 Other long term (current) drug therapy: Secondary | ICD-10-CM | POA: Insufficient documentation

## 2019-07-21 DIAGNOSIS — F4310 Post-traumatic stress disorder, unspecified: Secondary | ICD-10-CM | POA: Insufficient documentation

## 2019-07-21 DIAGNOSIS — F314 Bipolar disorder, current episode depressed, severe, without psychotic features: Secondary | ICD-10-CM | POA: Insufficient documentation

## 2019-07-21 DIAGNOSIS — R45851 Suicidal ideations: Secondary | ICD-10-CM

## 2019-07-21 DIAGNOSIS — Z20822 Contact with and (suspected) exposure to covid-19: Secondary | ICD-10-CM | POA: Insufficient documentation

## 2019-07-21 LAB — COMPREHENSIVE METABOLIC PANEL
ALT: 19 U/L (ref 0–44)
AST: 20 U/L (ref 15–41)
Albumin: 4.3 g/dL (ref 3.5–5.0)
Alkaline Phosphatase: 49 U/L (ref 38–126)
Anion gap: 10 (ref 5–15)
BUN: 8 mg/dL (ref 6–20)
CO2: 24 mmol/L (ref 22–32)
Calcium: 9.5 mg/dL (ref 8.9–10.3)
Chloride: 106 mmol/L (ref 98–111)
Creatinine, Ser: 0.87 mg/dL (ref 0.44–1.00)
GFR calc Af Amer: >60 mL/min (ref >60)
GFR calc non Af Amer: >60 mL/min (ref >60)
Glucose, Bld: 95 mg/dL (ref 70–99)
Potassium: 3.7 mmol/L (ref 3.5–5.1)
Sodium: 140 mmol/L (ref 135–145)
Total Bilirubin: 0.6 mg/dL (ref 0.3–1.2)
Total Protein: 6.7 g/dL (ref 6.5–8.1)

## 2019-07-21 LAB — RAPID URINE DRUG SCREEN, HOSP PERFORMED
Amphetamines: NOT DETECTED
Barbiturates: NOT DETECTED
Benzodiazepines: NOT DETECTED
Cocaine: NOT DETECTED
Opiates: NOT DETECTED
Tetrahydrocannabinol: POSITIVE — AB

## 2019-07-21 LAB — SALICYLATE LEVEL: Salicylate Lvl: <7 mg/dL — ABNORMAL LOW (ref 7.0–30.0)

## 2019-07-21 LAB — CBC
HCT: 40.5 % (ref 36.0–46.0)
Hemoglobin: 13.1 g/dL (ref 12.0–15.0)
MCH: 30 pg (ref 26.0–34.0)
MCHC: 32.3 g/dL (ref 30.0–36.0)
MCV: 92.7 fL (ref 80.0–100.0)
Platelets: 291 10*3/uL (ref 150–400)
RBC: 4.37 MIL/uL (ref 3.87–5.11)
RDW: 13.2 % (ref 11.5–15.5)
WBC: 7.9 10*3/uL (ref 4.0–10.5)
nRBC: 0 % (ref 0.0–0.2)

## 2019-07-21 LAB — RESPIRATORY PANEL BY RT PCR (FLU A&B, COVID)
Influenza A by PCR: NEGATIVE
Influenza B by PCR: NEGATIVE
SARS Coronavirus 2 by RT PCR: NEGATIVE

## 2019-07-21 LAB — ETHANOL: Alcohol, Ethyl (B): <10 mg/dL (ref <10)

## 2019-07-21 LAB — ACETAMINOPHEN LEVEL: Acetaminophen (Tylenol), Serum: <10 ug/mL — ABNORMAL LOW (ref 10–30)

## 2019-07-21 MED ORDER — ACETAMINOPHEN 325 MG PO TABS: 650.0000 mg | ORAL_TABLET | ORAL | Status: DC | PRN

## 2019-07-21 MED ORDER — ALUM & MAG HYDROXIDE-SIMETH 200-200-20 MG/5ML PO SUSP: 30.0000 mL | Freq: Four times a day (QID) | ORAL | Status: DC | PRN

## 2019-07-21 MED ORDER — ZOLPIDEM TARTRATE 5 MG PO TABS: 5.0000 mg | ORAL_TABLET | Freq: Every evening | ORAL | Status: DC | PRN

## 2019-07-21 MED ORDER — ONDANSETRON HCL 4 MG PO TABS: 4.0000 mg | ORAL_TABLET | Freq: Three times a day (TID) | ORAL | Status: DC | PRN

## 2019-07-21 NOTE — ED Triage Notes (Signed)
Patient had to quit job to care for her spouse who had industrial accident back in September. Patient continually states I want to die. Cried throughout the entire assessment.
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
## 2019-07-21 NOTE — ED Notes (Signed)
Pt has been wanded and is waiting in triage.

## 2019-07-21 NOTE — ED Notes (Signed)
Pt changed into purple scrubs and all belongings are with husband. [facility] has been called to wand pt.

## 2019-07-21 NOTE — ED Notes (Signed)
TTS in process 
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
## 2019-07-22 ENCOUNTER — Inpatient Hospital Stay (HOSPITAL_COMMUNITY)
Admission: AD | Admit: 2019-07-22 | Discharge: 2019-07-24 | DRG: 885 | Disposition: A | Discharge status: Home / Self Care | Payer: Federal, State, Local not specified - Other | Source: Intra-hospital | Attending: Psychiatry | Admitting: Psychiatry

## 2019-07-22 ENCOUNTER — Encounter (HOSPITAL_COMMUNITY): Payer: Self-pay | Admitting: Behavioral Health

## 2019-07-22 DIAGNOSIS — F1290 Cannabis use, unspecified, uncomplicated: Secondary | ICD-10-CM | POA: Diagnosis present

## 2019-07-22 DIAGNOSIS — F603 Borderline personality disorder: Secondary | ICD-10-CM | POA: Diagnosis present

## 2019-07-22 DIAGNOSIS — R45851 Suicidal ideations: Secondary | ICD-10-CM | POA: Diagnosis present

## 2019-07-22 DIAGNOSIS — F339 Major depressive disorder, recurrent, unspecified: Secondary | ICD-10-CM | POA: Diagnosis present

## 2019-07-22 DIAGNOSIS — F411 Generalized anxiety disorder: Secondary | ICD-10-CM | POA: Diagnosis present

## 2019-07-22 DIAGNOSIS — G4700 Insomnia, unspecified: Secondary | ICD-10-CM | POA: Diagnosis present

## 2019-07-22 DIAGNOSIS — F909 Attention-deficit hyperactivity disorder, unspecified type: Secondary | ICD-10-CM | POA: Diagnosis present

## 2019-07-22 DIAGNOSIS — F332 Major depressive disorder, recurrent severe without psychotic features: Secondary | ICD-10-CM | POA: Diagnosis present

## 2019-07-22 DIAGNOSIS — F329 Major depressive disorder, single episode, unspecified: Secondary | ICD-10-CM | POA: Diagnosis present

## 2019-07-22 DIAGNOSIS — Z888 Allergy status to other drugs, medicaments and biological substances status: Secondary | ICD-10-CM

## 2019-07-22 DIAGNOSIS — Z8616 Personal history of COVID-19: Secondary | ICD-10-CM | POA: Diagnosis not present

## 2019-07-22 DIAGNOSIS — R451 Restlessness and agitation: Secondary | ICD-10-CM | POA: Diagnosis present

## 2019-07-22 DIAGNOSIS — F4310 Post-traumatic stress disorder, unspecified: Secondary | ICD-10-CM | POA: Diagnosis present

## 2019-07-22 DIAGNOSIS — F988 Other specified behavioral and emotional disorders with onset usually occurring in childhood and adolescence: Secondary | ICD-10-CM | POA: Diagnosis present

## 2019-07-22 MED ORDER — MAGNESIUM HYDROXIDE 400 MG/5ML PO SUSP: 30.0000 mL | Freq: Every day | ORAL | Status: DC | PRN

## 2019-07-22 MED ORDER — ALUM & MAG HYDROXIDE-SIMETH 200-200-20 MG/5ML PO SUSP: 30.0000 mL | ORAL | Status: DC | PRN

## 2019-07-22 MED ORDER — LORAZEPAM 1 MG PO TABS
ORAL_TABLET | ORAL | Status: AC
  Filled 2019-07-22: qty 2

## 2019-07-22 MED ORDER — LORAZEPAM 1 MG PO TABS
2.0000 mg | ORAL_TABLET | Freq: Once | ORAL | Status: AC
  Administered 2019-07-22: 2 mg via ORAL

## 2019-07-22 MED ORDER — ACETAMINOPHEN 325 MG PO TABS: 650.0000 mg | ORAL_TABLET | Freq: Four times a day (QID) | ORAL | Status: DC | PRN

## 2019-07-22 MED ORDER — DIPHENHYDRAMINE HCL 50 MG/ML IJ SOLN
INTRAMUSCULAR | Status: AC
  Filled 2019-07-22: qty 1

## 2019-07-22 MED ORDER — BUPROPION HCL ER (XL) 150 MG PO TB24
150.0000 mg | ORAL_TABLET | Freq: Every day | ORAL | Status: DC
  Administered 2019-07-23 – 2019-07-24 (×2): 150 mg via ORAL
  Filled 2019-07-22 (×4): qty 1

## 2019-07-22 MED ORDER — HYDROXYZINE HCL 25 MG PO TABS
25.0000 mg | ORAL_TABLET | Freq: Four times a day (QID) | ORAL | Status: DC | PRN
  Administered 2019-07-22 (×2): 25 mg via ORAL
  Filled 2019-07-22 (×2): qty 1

## 2019-07-22 MED ORDER — LORAZEPAM 1 MG PO TABS
1.0000 mg | ORAL_TABLET | Freq: Once | ORAL | Status: AC | PRN
  Administered 2019-07-24: 1 mg via ORAL
  Filled 2019-07-22 (×2): qty 1

## 2019-07-22 MED ORDER — DIPHENHYDRAMINE HCL 25 MG PO CAPS
ORAL_CAPSULE | ORAL | Status: AC
  Administered 2019-07-22: 50 mg
  Filled 2019-07-22: qty 2

## 2019-07-22 MED ORDER — TRAZODONE HCL 100 MG PO TABS
ORAL_TABLET | ORAL | Status: AC
  Filled 2019-07-22: qty 1

## 2019-07-22 MED ORDER — FLUOXETINE HCL 20 MG PO CAPS
20.0000 mg | ORAL_CAPSULE | Freq: Every day | ORAL | Status: DC
  Administered 2019-07-22 – 2019-07-24 (×3): 20 mg via ORAL
  Filled 2019-07-22 (×6): qty 1

## 2019-07-22 MED ORDER — DIPHENHYDRAMINE HCL 50 MG PO CAPS
50.0000 mg | ORAL_CAPSULE | Freq: Once | ORAL | Status: AC
  Filled 2019-07-22: qty 1
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
## 2019-07-22 NOTE — Progress Notes (Signed)
Patient was notified that group was beginning  Pt got off the phone and entered the dayroom verbally aggressive, say mother f'in this and mother f'in that geiting louder and louder. This writer told patient to please stop cussing and we would not have that energy and language in the dayroom. Pt reported not having any mother f'in medicine like the other patients and that they had mother f'in groups all day long. Pt continued to escalate and I moved group to the other group room on [address].

## 2019-07-22 NOTE — Progress Notes (Signed)
The patient is a 49 year old female with a past psychiatric history significant for borderline personality disorder, posttraumatic stress disorder, and reported bipolar disorder who presented to the [facility] emergency department on 07/21/2019 suicidal ideation. The nurse notifies this writer about the patient's behavior on the unit. She was prescribed 2 mg of Ativan and 50 mg of Benadryl. The patient is allergic to Klonopin. This Clinical research associate observed the patient talking to her nurse, and she is calming down.
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
## 2019-07-22 NOTE — Progress Notes (Signed)
D. Pt presents with a depressed affect/mood- calm, cooperative behavior- friendly, occasionally tearful, during interactions. Pt currently denies SI/HI and AVH  A. Labs and vitals monitored. Pt supported emotionally and encouraged to express concerns and ask questions.   R. Pt remains safe with 15 minute checks. Will continue POC.

## 2019-07-22 NOTE — Progress Notes (Signed)
   07/22/19 2223  COVID-19 Daily Checkoff  Have you had a fever (temp > 37.80C/100F)  in the past 24 hours?  No  If you have had runny nose, nasal congestion, sneezing in the past 24 hours, has it worsened? No  COVID-19 EXPOSURE  Have you traveled outside the state in the past 14 days? No  Have you been in contact with someone with a confirmed diagnosis of COVID-19 or PUI in the past 14 days without wearing appropriate PPE? No  Have you been living in the same home as a person with confirmed diagnosis of COVID-19 or a PUI (household contact)? No  Have you been diagnosed with COVID-19? No
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
## 2019-07-22 NOTE — Progress Notes (Signed)
   07/22/19 2226  Psych Admission Type (Psych Patients Only)  Admission Status Voluntary  Psychosocial Assessment  Patient Complaints Anger;Anxiety;Agitation;Crying spells;Irritability  Eye Contact Fair  Facial Expression Angry;Animated;Anxious;Trembling lip  Affect Anxious;Labile;Preoccupied;Irritable;Threatening  Speech Aggressive;Argumentative;Rapid;Loud;Abusive  Interaction Assertive;Hostile;Defensive;Demanding;Manipulative  Motor Activity Other (Comment) (WDL)  Appearance/Hygiene In scrubs  Behavior Characteristics Combative;Agressive verbally;Agitated;Irritable;Intrusive  Mood Labile;Angry;Threatening;Irritable  Aggressive Behavior  Targets Other (Comment) (staff)  Type of Behavior Verbal;Threatening  Effect No apparent injury  Thought Process  Coherency WDL  Content Blaming others;Preoccupation;Paranoia  Delusions None reported or observed  Perception WDL  Hallucination None reported or observed  Judgment Poor  Confusion None  Danger to Self  Current suicidal ideation? Denies  Self-Injurious Behavior No self-injurious ideation or behavior indicators observed or expressed   Agreement Not to Harm Self Yes  Description of Agreement verbal contract for safety  Danger to Others  Danger to Others Reported or observed  Danger to Others Abnormal  Harmful Behavior to others Threats of violence towards other people observed or expressed   Destructive Behavior No threats or harm toward property  Description of Harmful Behavior Threat of violence towards staff
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
## 2019-07-22 NOTE — Progress Notes (Signed)
D.  Pt extremely verbally aggressive towards MHT when asked to please watch her language in dayroom.  Pt then became focused on MHT as against her and continued to loudly curse in dayroom and in hall.  Pt also did this loudly on the phone.  Peers did not want to be in dayroom or hall because of patient's continued loud outbursts.  Pt stated to this staff member that she is overwhelmed and cannot control her anger or aggression at this time because of that.  "You people are supposed to know how to take care of people like me!".  Pt stated that she is not being given appropriate medication to control herself.  Pt states that she is going to report rude behavior of staff.  Pt screaming, yelling, and cursing one minute, then crying and tearful the next.  A.  Spoke with NP and orders received for medication for agitation.  Spoke with patient at length about her situation and the fact that she is overwhelmed with life at the moment.  Pt appeared to be calmer but had second outburst while this staff member was speaking with NP off of unit.  PRN medication in place in the event of further outbursts.  R. Pt in room resting in bed, will continue to closely monitor.

## 2019-07-22 NOTE — Tx Team (Signed)
Initial Treatment Plan 07/22/2019 4:05 AM Regan Rakers HDI:[id number]    PATIENT STRESSORS: Health problems Marital or family conflict Medication change or noncompliance   PATIENT STRENGTHS: General fund of knowledge Motivation for treatment/growth   PATIENT IDENTIFIED PROBLEMS:  risk for suicide  depression  "getting myself back to normal"  Medication non-compliance               DISCHARGE CRITERIA:  Improved stabilization in mood, thinking, and/or behavior Verbal commitment to aftercare and medication compliance  PRELIMINARY DISCHARGE PLAN: Attend aftercare/continuing care group Outpatient therapy  PATIENT/FAMILY INVOLVEMENT: This treatment plan has been presented to and reviewed with the patient, Melissa Davies.  The patient and family have been given the opportunity to ask questions and make suggestions.  Delos Haring, RN 07/22/2019, 4:05 AM
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
## 2019-07-22 NOTE — Progress Notes (Signed)
Psychoeducational Group Note  Date:  07/22/2019 Time: 2045  Group Topic/Focus:  wrap up group  Participation Level: Did Not Attend  Participation Quality:  Not Applicable  Affect:  Not Applicable  Cognitive:  Not Applicable  Insight:  Not Applicable  Engagement in Group: Not Applicable  Additional Comments:  Pt was cursing and becoming verbally aggressive in dayroom. Pt asked to please watch her language and energy in the dayroom.   Pt stated " we have been having mother f'in groups all day long, and why does everyone else have medication but not me. Group was moved to another room and patient was asked to not attend.   Marcille Buffy 07/22/2019, 9:49 PM

## 2019-07-22 NOTE — [facility] Group Notes (Signed)
Adult Psychoeducational Group Note  Date:  07/22/2019 Time:  12:29 PM  Group Topic/Focus:  Goals Group:   The focus of this group is to help patients establish daily goals to achieve during treatment and discuss how the patient can incorporate goal setting into their daily lives to aide in recovery.  Participation Level:  Active  Participation Quality:  Monopolizing  Affect:  Flat  Cognitive:  Lacking  Insight: Lacking  Engagement in Group:  Distracting and Monopolizing  Modes of Intervention:  Education and Problem-solving  Additional Comments:  Pt sitting in the chair in a balled up position. Affect flat and when Pt/. Spoke she was tearful. States her world is falling apart. Rates her energy as a "O".  Dione Housekeeper 07/22/2019, 12:29 PM

## 2019-07-23 MED ORDER — MELATONIN 3 MG PO TABS
3.0000 mg | ORAL_TABLET | Freq: Every day | ORAL | Status: DC
  Administered 2019-07-23: 3 mg via ORAL
  Filled 2019-07-23 (×2): qty 1

## 2019-07-23 MED ORDER — GABAPENTIN 300 MG PO CAPS
300.0000 mg | ORAL_CAPSULE | Freq: Three times a day (TID) | ORAL | Status: DC
  Administered 2019-07-23 – 2019-07-24 (×3): 300 mg via ORAL
  Filled 2019-07-23 (×7): qty 1

## 2019-07-23 MED ORDER — TRAZODONE HCL 100 MG PO TABS
100.0000 mg | ORAL_TABLET | Freq: Every evening | ORAL | Status: DC | PRN
  Filled 2019-07-23: qty 1
  Filled 2019-07-23: qty 7

## 2019-07-23 NOTE — Progress Notes (Signed)
[facility]  07/23/2019 12:23 PM Melissa Davies  MRN:  [id number]  Subjective: Melissa Davies reports, "I'm doing better today, but I was not last night. The reason being I was not medicated well yesterday after my arrival here. So, It was not my fault. I could not sleep last night & the nurse finally gave me something for sleep & it helped me. I don't do well on Vistaril, it urns me into a beast. I think that was what happened last night. I was quite upset then. Today, the group sessions has made a lot of difference. I need counseling after discharge. It will indeed help me".  Objective: Patient is a [age] year old female with a past psychiatric history significant for borderline personality disorder, posttraumatic stress disorder and reported bipolar disorder who presented to [facility] emergency department on 07/21/2019 with suicidal ideation. The patient stated she had been under increasing recent stress. She stated that her husband was injured in an industrial accident and is currently disabled. He is unable to work and she has to care for him significantly given his physical issues. Melissa Davies is seen, chart reviewed. The chart findings discussed with the treatment team. She presents alert, oriented & aware of situation. She is visible on the unit, attending group sessions. She says the group sessions are helpful & would like to be referred to a counselor upon discharge. She complained of feeling irritated & agitated last night as she feels she was not properly medicated [facility]. She says Prozac is not going to help her much. She also says Vistaril is not good for her as it has the tendency to turn her into a beast. Vistaril has been discontinued at this time. Patient is started on gabapentin 300 mg po tid for agitation & Melatonin 3 mg po Q hs for insomnia. She currently rates her depression #5 & anxiety #6. She denies any side effects from her current plan of  care. She denies any SIHI, AVH, delusional thoughts or paranoia. She does not appear to be responding to any internal stimuli. She is in agreement to continue her current plan of care as already in progress.  Principal Problem: MDD (major depressive disorder), recurrent episode, severe (HCC)  Diagnosis: Principal Problem:   MDD (major depressive disorder), recurrent episode, severe (HCC) Active Problems:   PTSD (post-traumatic stress disorder)   ADD (attention deficit disorder)   Anxiety state   Depression, reactive   Suicidal ideation   Major depressive disorder, recurrent episode (HCC)  Total Time spent with patient: 25 minutes  Past Psychiatric History: See H&P  Past Medical History:  Past Medical History:  Diagnosis Date  . Anxiety     Past Surgical History:  Procedure Laterality Date  . TUBAL LIGATION     Family History:  Family History  Problem Relation Age of Onset  . ADD / ADHD Son    Family Psychiatric  History: See H&P  Social History:  Social History   Substance and Sexual Activity  Alcohol Use Not Currently   Comment: occasional      Social History   Substance and Sexual Activity  Drug Use Yes  . Types: Marijuana    Social History   Socioeconomic History  . Marital status: Married    Spouse name: Not on file  . Number of children: Not on file  . Years of education: Not on file  . Highest education level: Not on file  Occupational History  .  Occupation: alterations  Tobacco Use  . Smoking status: Current Some Day Smoker    Years: 1.00    Types: Cigarettes  . Smokeless tobacco: Never Used  Substance and Sexual Activity  . Alcohol use: Not Currently    Comment: occasional   . Drug use: Yes    Types: Marijuana  . Sexual activity: Yes    Partners: Male    Birth control/protection: None  Other Topics Concern  . 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 Difficulty of Paying Living Expenses:   Food Insecurity:   . Worried About Programme researcher, broadcasting/film/video in the Last Year:   . Barista in the Last Year:   Transportation Needs:   . Freight forwarder (Medical):   Marland Kitchen Lack of Transportation (Non-Medical):   Physical Activity:   . Days of Exercise per Week:   . Minutes of Exercise per Session:   Stress:   . Feeling of Stress :   Social Connections:   . Frequency of Communication with Friends and Family:   . Frequency of Social Gatherings with Friends and Family:   . Attends Religious Services:   . Active Member of Clubs or Organizations:   . Attends Banker Meetings:   Marland Kitchen Marital Status:    Additional Social History:   Sleep: Fair  Appetite:  Fair  Current Medications: Current Facility-Administered Medications  Medication Dose Route Frequency Provider Last Rate Last Admin  . acetaminophen (TYLENOL) tablet 650 mg  650 mg Oral Q6H PRN Patsy Varma I, NP      . alum & mag hydroxide-simeth (MAALOX/MYLANTA) 200-200-20 MG/5ML suspension 30 mL  30 mL Oral Q4H PRN Rilla Buckman I, NP      . buPROPion (WELLBUTRIN XL) 24 hr tablet 150 mg  150 mg Oral Daily Sampson Self I, NP   150 mg at 07/23/19 0901  . FLUoxetine (PROZAC) capsule 20 mg  20 mg Oral Daily Armandina Stammer I, NP   20 mg at 07/23/19 0901  . gabapentin (NEURONTIN) capsule 300 mg  300 mg Oral TID Armandina Stammer I, NP      . hydrOXYzine (ATARAX/VISTARIL) tablet 25 mg  25 mg Oral Q6H PRN Gillermo Murdoch, NP   25 mg at 07/22/19 1911  . LORazepam (ATIVAN) tablet 1 mg  1 mg Oral Once PRN Gillermo Murdoch, NP      . magnesium hydroxide (MILK OF MAGNESIA) suspension 30 mL  30 mL Oral Daily PRN Armandina Stammer I, NP       Lab Results:  Results for orders placed or performed during the hospital encounter of 07/21/19 (from the past 48 hour(s))  Comprehensive metabolic panel     Status: None   Collection Time:  07/21/19  6:26 PM  Result Value Ref Range   Sodium 140 135 - 145 mmol/L   Potassium 3.7 3.5 - 5.1 mmol/L   Chloride 106 98 - 111 mmol/L   CO2 24 22 - 32 mmol/L   Glucose, Bld 95 70 - 99 mg/dL    Comment: Glucose reference range applies only to samples taken after fasting for at least 8 hours.   BUN 8 6 - 20 mg/dL   Creatinine, Ser 9.47 0.44 - 1.00 mg/dL   Calcium 9.5 8.9 - 09.6 mg/dL   Total Protein 6.7 6.5 -  8.1 g/dL   Albumin 4.3 3.5 - 5.0 g/dL   AST 20 15 - 41 U/L   ALT 19 0 - 44 U/L   Alkaline Phosphatase 49 38 - 126 U/L   Total Bilirubin 0.6 0.3 - 1.2 mg/dL   GFR calc non Af Amer >60 >60 mL/min   GFR calc Af Amer >60 >60 mL/min   Anion gap 10 5 - 15    Comment: Performed at [facility], [address] [place], [place] [address]  Ethanol     Status: None   Collection Time: 07/21/19  6:26 PM  Result Value Ref Range   Alcohol, Ethyl (B) <10 <10 mg/dL    Comment: (NOTE) Lowest detectable limit for serum alcohol is 10 mg/dL. For medical purposes only. Performed at [facility], [address], [place], [place] [address]   Salicylate level     Status: Abnormal   Collection Time: 07/21/19  6:26 PM  Result Value Ref Range   Salicylate Lvl <7.0 (L) 7.0 - 30.0 mg/dL    Comment: Performed at [facility], [address] [place], [place] [address]  Acetaminophen level     Status: Abnormal   Collection Time: 07/21/19  6:26 PM  Result Value Ref Range   Acetaminophen (Tylenol), Serum <10 (L) 10 - 30 ug/mL    Comment: (NOTE) Therapeutic concentrations vary significantly. A range of 10-30 ug/mL  may be an effective concentration for many patients. However, some  are best treated at concentrations outside of this range. Acetaminophen concentrations >150 ug/mL at 4 hours after ingestion  and >50 ug/mL at 12 hours after ingestion are often associated with  toxic reactions. Performed at [facility], [address], [place], [place] [address]   cbc      Status: None   Collection Time: 07/21/19  6:26 PM  Result Value Ref Range   WBC 7.9 4.0 - 10.5 K/uL   RBC 4.37 3.87 - 5.11 MIL/uL   Hemoglobin 13.1 12.0 - 15.0 g/dL   HCT 16.6 06.3 - 01.6 %   MCV 92.7 80.0 - 100.0 fL   MCH 30.0 26.0 - 34.0 pg   MCHC 32.3 30.0 - 36.0 g/dL   RDW 01.0 93.2 - 35.5 %   Platelets 291 150 - 400 K/uL   nRBC 0.0 0.0 - 0.2 %    Comment: Performed at [facility], [address], [place], [place] [address]  Respiratory Panel by RT PCR (Flu A&B, Covid) - Urine, Random     Status: None   Collection Time: 07/21/19  8:01 PM   Specimen: Urine, Random  Result Value Ref Range   SARS Coronavirus 2 by RT PCR NEGATIVE NEGATIVE    Comment: (NOTE) SARS-CoV-2 target nucleic acids are NOT DETECTED. The SARS-CoV-2 RNA is generally detectable in upper respiratoy specimens during the acute phase of infection. The lowest concentration of SARS-CoV-2 viral copies this assay can detect is 131 copies/mL. A negative result does not preclude SARS-Cov-2 infection and should not be used as the sole basis for treatment or other patient management decisions. A negative result may occur with  improper specimen collection/handling, submission of specimen other than nasopharyngeal swab, presence of viral mutation(s) within the areas targeted by this assay, and inadequate number of viral copies (<131 copies/mL). A negative result must be combined with clinical observations, patient history, and epidemiological information. The expected result is Negative. Fact Sheet for Patients:  https://www.moore.com/ Fact Sheet for  Healthcare Providers:  https://www.young.biz/ This test is not yet ap proved or cleared by the [place] and  has been authorized for detection and/or diagnosis of SARS-CoV-2 by FDA under an Emergency Use Authorization (EUA). This EUA will remain  in effect (meaning this test can be used) for the duration of  the COVID-19 declaration under Section 564(b)(1) of the Act, 21 U.S.C. section 360bbb-3(b)(1), unless the authorization is terminated or revoked sooner.    Influenza A by PCR NEGATIVE NEGATIVE   Influenza B by PCR NEGATIVE NEGATIVE    Comment: (NOTE) The Xpert Xpress SARS-CoV-2/FLU/RSV assay is intended as an aid in  the diagnosis of influenza from Nasopharyngeal swab specimens and  should not be used as a sole basis for treatment. Nasal washings and  aspirates are unacceptable for Xpert Xpress SARS-CoV-2/FLU/RSV  testing. Fact Sheet for Patients: https://www.moore.com/ Fact Sheet for Healthcare Providers: https://www.young.biz/ This test is not yet approved or cleared by the [place] FDA and  has been authorized for detection and/or diagnosis of SARS-CoV-2 by  FDA under an Emergency Use Authorization (EUA). This EUA will remain  in effect (meaning this test can be used) for the duration of the  Covid-19 declaration under Section 564(b)(1) of the Act, 21  U.S.C. section 360bbb-3(b)(1), unless the authorization is  terminated or revoked. Performed at [facility], [address], [place], [place] [address]   Rapid urine drug screen (hospital performed)     Status: Abnormal   Collection Time: 07/21/19  8:02 PM  Result Value Ref Range   Opiates NONE DETECTED NONE DETECTED   Cocaine NONE DETECTED NONE DETECTED   Benzodiazepines NONE DETECTED NONE DETECTED   Amphetamines NONE DETECTED NONE DETECTED   Tetrahydrocannabinol POSITIVE (A) NONE DETECTED   Barbiturates NONE DETECTED NONE DETECTED    Comment: (NOTE) DRUG SCREEN FOR MEDICAL PURPOSES ONLY.  IF CONFIRMATION IS NEEDED FOR ANY PURPOSE, NOTIFY LAB WITHIN 5 DAYS. LOWEST DETECTABLE LIMITS FOR URINE DRUG SCREEN Drug Class                     Cutoff (ng/mL) Amphetamine and metabolites    1000 Barbiturate and metabolites    200 Benzodiazepine                 200 Tricyclics and  metabolites     300 Opiates and metabolites        300 Cocaine and metabolites        300 THC                            50 Performed at [facility], [address], [place], [place] [address]    Blood Alcohol level:  Lab Results  Component Value Date   ETH <10 07/21/2019   Metabolic Disorder Labs: No results found for: HGBA1C, MPG No results found for: PROLACTIN No results found for: CHOL, TRIG, HDL, CHOLHDL, VLDL, LDLCALC  Physical Findings: AIMS: Facial and Oral Movements Muscles of Facial Expression: None, normal Lips and Perioral Area: None, normal Jaw: None, normal Tongue: None, normal,Extremity Movements Upper (arms, wrists, hands, fingers): None, normal Lower (legs, knees, ankles, toes): None, normal, Trunk Movements Neck, shoulders, hips: None, normal, Overall Severity Severity of abnormal movements (highest score from questions above): None, normal Incapacitation due to abnormal movements: None, normal Patient's awareness of abnormal movements (rate only patient's report): No Awareness, Dental Status Current problems with teeth and/or dentures?:  No Does patient usually wear dentures?: No  CIWA:    COWS:     Musculoskeletal: Strength & Muscle Tone: within normal limits Gait & Station: normal Patient leans: N/A  Psychiatric Specialty Exam: Physical Exam  Nursing note and vitals reviewed. Constitutional: She is oriented to person, place, and time. She appears well-developed.  Cardiovascular: Normal rate.  Respiratory: Effort normal.  Genitourinary:    Genitourinary Comments: Deferred   Musculoskeletal:        General: Normal range of motion.     Cervical back: Normal range of motion.  Neurological: She is alert and oriented to person, place, and time.  Skin: Skin is warm and dry.    Review of Systems  Constitutional: Negative.   HENT: Negative.   Eyes: Negative.   Respiratory: Negative.   Cardiovascular: Negative.   Gastrointestinal: Negative.    Endocrine: Negative.   Genitourinary: Negative.   Musculoskeletal: Negative.   Skin: Negative.   Allergic/Immunologic: Negative for environmental allergies and food allergies.       Allergies: Klonopin  Neurological: Negative.   Psychiatric/Behavioral: Positive for agitation, behavioral problems, dysphoric mood and sleep disturbance. Negative for confusion, decreased concentration, hallucinations, self-injury and suicidal ideas. The patient is nervous/anxious. The patient is not hyperactive.     Blood pressure 140/78, pulse (!) 57, temperature 98.2 F (36.8 C), temperature source Oral, resp. rate 18, height 5' (1.524 m), weight 78.5 kg.Body mass index is 33.79 kg/m.  General Appearance: Casual  Eye Contact:  Fair  Speech:  Clear and Coherent and Pressured  Volume:  Increased  Mood:  Angry, Anxious, Depressed and Irritable  Affect:  Labile  Thought Process:  Coherent and Descriptions of Associations: Intact  Orientation:  Full (Time, Place, and Person)  Thought Content:  Rumination  Suicidal Thoughts:  Yes.  without intent/plan  Homicidal Thoughts:  "No, but I hate everybody right now"  Memory:  Immediate;   Good Recent;   Good Remote;   Good  Judgement:  Fair  Insight:  Fair  Psychomotor Activity:  Increased  Concentration:  Concentration: Fair and Attention Span: Fair  Recall:  Good  Fund of Knowledge:  Fair  Language:  Good  Akathisia:  Negative  Handed:  Right  AIMS (if indicated):     Assets:  Communication Skills Desire for Improvement Physical Health  ADL's:  Intact  Cognition:  WNL    Sleep:  Number of Hours: 5   Treatment Plan Summary: Daily contact with patient to assess and evaluate symptoms and progress in treatment and Medication management.  -Continue inpatient hospitalization.  -Will continue today 07/23/2019 plan as below except where it is noted.  Agitation/anxiety.     - Initiated Gabapentin 300 mg po tid.     - Dicontinued Vistaril 25 mg po Q  hrs prn, patient says it turns her into a beast.  Depression.     - Continue Wellbutrin XL 150 mg po daily.     - Continue Prozac 20 mg po daily.  Insomnia.     - Initiated Melatonin 3 mg po Q hs.  Encourage group participation. Discharge disposition in progress.  Lindell Spar, NP, PMHNP,FNP-BC 07/23/2019, 12:23 PM

## 2019-07-23 NOTE — Progress Notes (Signed)
D. Pt presents as calmer this am, although she continues to blame [facility] for her outbursts last night. Per pt's self- inventory, pt rated her depression, hopelessness and anxiety all 10's this am. Pt reports that her goal today is to "pray" Pt currently denies SI/HI and AVH A. Labs and vitals monitored. Pt given and educated on medications. Pt offered prn meds for her anxiety, but pt refused Pt supported emotionally and encouraged to express concerns and ask questions.   R. Pt remains safe with 15 minute checks. Will continue POC.

## 2019-07-23 NOTE — Progress Notes (Signed)
Pt very pleasant this evening, states that she must have "had an allergic reaction" to Vistaril yesterday.  Pt states "that just made me a whole different person.  Pt active in group, remains somewhat intrusive, does appear to be getting along with peers very well tonight.  Pt denies SI/HI/AVH at this time and states "I had a much better day today".  Pt states that she has enjoyed groups today.

## 2019-07-23 NOTE — [facility] Group Notes (Signed)
Adult Psychoeducational Group Note  Date:  07/23/2019 Time:  3:03 PM  Group Topic/Focus:  Identifying Needs:   The focus of this group is to help patients identify their personal needs that have been historically problematic and identify healthy behaviors to address their needs.  Participation Level:  Active  Participation Quality:  Appropriate  Affect:  Anxious  Cognitive:  Appropriate  Insight: improving  Engagement in Group:  Engaged  Modes of Intervention:  Discussion, Education, Socialization and Support  Additional Comments:  Pt partisipated in the group fully. Was able to state that she is writing her 35 positives about herself  Dione Housekeeper 07/23/2019, 3:03 PM

## 2019-07-23 NOTE — [facility] Suicide Risk Assessment (Signed)
[facility] INPATIENT:  Family/Significant Other Suicide Prevention Education  Suicide Prevention Education:  Education Completed; husband Shalanda Brogden [phone],  (name of family member/significant other) has been identified by the patient as the family member/significant other with whom the patient will be residing, and identified as the person(s) who will aid the patient in the event of a mental health crisis (suicidal ideations/suicide attempt).  With written consent from the patient, the family member/significant other has been provided the following suicide prevention education, prior to the and/or following the discharge of the patient.   Husband states patient is "very borderline, maybe bipolar."  He misses her and is ready for her to come home, but understands that the hospital has to stabilize her first.  He was very receptive not only to this information, but to the fact that his wife, the patient, is also very concerned about him possibly being depressed because of his accident and current inability to work.   The suicide prevention education provided includes the following:  Suicide risk factors  Suicide prevention and interventions  National Suicide Hotline telephone number  [facility] assessment telephone number  [facility] Emergency Assistance 911  [facility] 352 and/or Residential Mobile Crisis Unit telephone number  Request made of family/significant other to:  Remove weapons (e.g., guns, rifles, knives), all items previously/currently identified as safety concern.    Remove drugs/medications (over-the-counter, prescriptions, illicit drugs), all items previously/currently identified as a safety concern.  The family member/significant other verbalizes understanding of the suicide prevention education information provided.  The family member/significant other agrees to remove the items of safety concern listed above.  Carloyn Jaeger Grossman-Orr 07/23/2019,  4:45 PM

## 2019-07-23 NOTE — Progress Notes (Signed)
   07/23/19 2053  COVID-19 Daily Checkoff  Have you had a fever (temp > 37.80C/100F)  in the past 24 hours?  No  If you have had runny nose, nasal congestion, sneezing in the past 24 hours, has it worsened? No  COVID-19 EXPOSURE  Have you traveled outside the state in the past 14 days? No  Have you been in contact with someone with a confirmed diagnosis of COVID-19 or PUI in the past 14 days without wearing appropriate PPE? No  Have you been living in the same home as a person with confirmed diagnosis of COVID-19 or a PUI (household contact)? No  Have you been diagnosed with COVID-19? No

## 2019-07-23 NOTE — [facility] Group Notes (Signed)
[facility] LCSW Group Therapy Note  07/23/2019    Type of Therapy and Topic:  Group Therapy:  Adding Supports Including Yourself  Participation Level:  Active   Description of Group:   Patients in this group were introduced to the concept that additional supports including self-support are an essential part of recovery.  Patients listed what supports they believe they need to add to their lives to achieve their goals at discharge, and they listed such things as therapist, family, doctor, support groups, 12-step groups and service animals.   A song entitled "My Own Hero" was played and a group discussion ensued in which patients stated they could relate to the song and it inspired them to realize they have be willing to help themselves in order to succeed, because other people cannot achieve sobriety or stability for them.  "Fight For It" was played, then "I Am Enough" to encourage patients.  They discussed the impact on them and how they must remain convinced that their lives are worth the effort it takes to become sober and/or stable.  Therapeutic Goals: 1)  demonstrate the importance of being a key part of one's own support system 2)  discuss various available supports 3)  encourage patient to use music as part of their self-support and focus on goals 4)  elicit ideas from patients about supports that need to be added   Summary of Patient Progress:  The patient expressed that her [employer] faith and her best friend are consistently healthy supports for her, while her husband vacillates between healthy and unhealthy.  Other people in her life including mother, sister, and brothers, are unhealthy supports "because they're crazy."  She was very emotional during the different songs, feeling their messages related to her situation.   Therapeutic Modalities:   Motivational Interviewing Activity  Carloyn Jaeger Grossman-Orr  11:58 AM

## 2019-07-23 NOTE — [facility] Group Notes (Signed)
Adult Psychoeducational Group Note  Date:  07/23/2019 Time:  11:57 AM  Group Topic/Focus:   PROGRESSIVE RELAXATION.Marland Kitchen A group where deep breathing is taught and tensing and relaxation muscle groups is used. Imagery is used with both deep breathing and tensing and relaxing of the muscle groups. Pt;s are asked to imagine 3 pillars that hold them up when they are not able to hold themselves up.  Participation Level:  Active  Participation Quality:  Appropriate  Affect:  Appropriate  Cognitive:  Oriented  Insight: Improving  Engagement in Group:  Engaged  Modes of Intervention:  Activity, Socialization and Support  Additional Comments:  Pt states her energy today is a 0, actions and affect are incongruent. Pt states what holds her up is music, her husband and her best friend.  Melissa Davies A 07/23/2019, 11:57 AM

## 2019-07-23 NOTE — Progress Notes (Signed)
   07/23/19 2056  Psych Admission Type (Psych Patients Only)  Admission Status Voluntary  Psychosocial Assessment  Patient Complaints Anxiety;Insomnia  Eye Contact Other (Comment) (appropriate)  Facial Expression Anxious  Affect Anxious;Labile  Speech Logical/coherent;Loud  Interaction Assertive;Intrusive  Motor Activity Fidgety  Appearance/Hygiene In scrubs  Behavior Characteristics Anxious  Mood Labile;Pleasant  Thought Process  Coherency WDL  Content Blaming others  Delusions None reported or observed  Perception WDL  Hallucination None reported or observed  Judgment Poor  Confusion None  Danger to Self  Current suicidal ideation? Denies  Self-Injurious Behavior No self-injurious ideation or behavior indicators observed or expressed   Agreement Not to Harm Self Yes  Description of Agreement verbal contract for safety  Danger to Others  Danger to Others None reported or observed  Danger to Others Abnormal  Harmful Behavior to others No threats or harm toward other people  Destructive Behavior No threats or harm toward property

## 2019-07-23 NOTE — [facility] Group Notes (Signed)
Adult Psychoeducational Group Note  Date:  07/23/2019 Time:  8:53 PM  Group Topic/Focus:  Wrap-Up Group:   The focus of this group is to help patients review their daily goal of treatment and discuss progress on daily workbooks.  Participation Level:  Active  Participation Quality:  Intrusive and Inattentive  Affect:  Anxious  Cognitive:  Alert and Appropriate  Insight: Appropriate  Engagement in Group:  Distracting  Modes of Intervention:  Discussion, Education and Limit-setting  Additional Comments:  Pt attended and participated in wrap up group this evening and rated their day a 6/10. Pt enjoyed mingling with the other peers on the hall. Pt completed their goal, which was to "be happy". Pt was intrusive in group, speaking out of turn, and signing songs when other pt are speaking. Pt was also telling other pt what to say when asked about their day.   Melissa Davies 07/23/2019, 8:53 PM

## 2019-07-24 DIAGNOSIS — F411 Generalized anxiety disorder: Secondary | ICD-10-CM

## 2019-07-24 MED ORDER — FLUOXETINE HCL 20 MG PO CAPS: 20.0000 mg | ORAL_CAPSULE | Freq: Every day | ORAL | 0 refills | Status: DC

## 2019-07-24 MED ORDER — TRAZODONE HCL 100 MG PO TABS: 100.0000 mg | ORAL_TABLET | Freq: Every evening | ORAL | 0 refills | Status: DC | PRN

## 2019-07-24 MED ORDER — GABAPENTIN 300 MG PO CAPS: 300.0000 mg | ORAL_CAPSULE | Freq: Three times a day (TID) | ORAL | 0 refills | Status: DC

## 2019-07-24 MED ORDER — MELATONIN 3 MG PO TABS: 3.0000 mg | ORAL_TABLET | Freq: Every day | ORAL | 0 refills | Status: DC

## 2019-07-24 MED ORDER — BUPROPION HCL ER (XL) 150 MG PO TB24: 150.0000 mg | ORAL_TABLET | Freq: Every day | ORAL | 0 refills | Status: DC

## 2019-07-24 NOTE — Progress Notes (Signed)
Pt discharged to lobby. Pt was stable and appreciative at that time. All papers, samples and prescriptions were given and valuables returned. Verbal understanding expressed. Denies SI/HI and A/VH. Pt given opportunity to express concerns and ask questions.  

## 2019-07-24 NOTE — Progress Notes (Signed)
Recreation Therapy Notes  Date:  4.12.21 Time: 0930 Location: 300 Hall Dayroom  Group Topic: Stress Management  Goal Area(s) Addresses:  Patient will identify positive stress management techniques. Patient will identify benefits of using stress management post d/c.  Behavioral Response:  Engaged  Intervention: Stress Management  Activity : Meditation.  LRT played a meditation that focused on looking at humanity as each individual working together to make the whole better.  Patients were to listen and follow along as meditation played.    Education:  Stress Management, Discharge Planning.   Education Outcome: Acknowledges Education  Clinical Observations/Feedback: Pt attended and participated in activity.     Caroll Rancher, LRT/CTRS         Lillia Abed, Markese Bloxham A 07/24/2019 11:11 AM
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
## 2019-07-24 NOTE — [facility] Suicide Risk Assessment (Signed)
[facility] Discharge Suicide Risk Assessment   Principal Problem: MDD (major depressive disorder), recurrent episode, severe (HCC) Discharge Diagnoses: Principal Problem:   MDD (major depressive disorder), recurrent episode, severe (HCC) Active Problems:   ADD (attention deficit disorder)   Anxiety state   Depression, reactive   PTSD (post-traumatic stress disorder)   Suicidal ideation   Major depressive disorder, recurrent episode (HCC)   Total Time spent with patient: 30 minutes  Musculoskeletal: Strength & Muscle Tone: within normal limits Gait & Station: normal Patient leans: N/A  Psychiatric Specialty Exam: Review of Systems  All other systems reviewed and are negative.   Blood pressure 140/78, pulse (!) 57, temperature 98.2 F (36.8 C), temperature source Oral, resp. rate 18, height 5' (1.524 m), weight 78.5 kg.Body mass index is 33.79 kg/m.  General Appearance: Casual  Eye Contact::  Good  Speech:  Normal Rate409  Volume:  Normal  Mood:  Euthymic  Affect:  Congruent  Thought Process:  Coherent and Descriptions of Associations: Intact  Orientation:  Full (Time, Place, and Person)  Thought Content:  Logical  Suicidal Thoughts:  No  Homicidal Thoughts:  No  Memory:  Immediate;   Good Recent;   Good Remote;   Good  Judgement:  Intact  Insight:  Fair  Psychomotor Activity:  Normal  Concentration:  Good  Recall:  Good  Fund of Knowledge:Good  Language: Good  Akathisia:  Negative  Handed:  Right  AIMS (if indicated):     Assets:  Communication Skills Desire for Improvement Housing Resilience Social Support Talents/Skills  Sleep:  Number of Hours: 5.75  Cognition: WNL  ADL's:  Intact   Mental Status Per Nursing Assessment::   On Admission:  Suicidal ideation indicated by patient  Demographic Factors:  Caucasian  Loss Factors: Decline in physical health and Financial problems/change in socioeconomic status  Historical Factors: Impulsivity  Risk  Reduction Factors:   Employed, Living with another person, especially a relative and Positive social support  Continued Clinical Symptoms:  Bipolar Disorder:   Depressive phase Depression:   Impulsivity  Cognitive Features That Contribute To Risk:  None    Suicide Risk:  Minimal: No identifiable suicidal ideation.  Patients presenting with no risk factors but with morbid ruminations; may be classified as minimal risk based on the severity of the depressive symptoms  Follow-up Information    [facility] Follow up.   Specialty: Professional Counselor Why: Referral needed for med mgmt and therapy Contact information: [employer] of the [place] [address] [place] [address] [phone]           Plan Of Care/Follow-up recommendations:  Activity:  ad lib  Antonieta Pert, MD 07/24/2019, 9:19 AM

## 2019-07-24 NOTE — Tx Team (Signed)
Interdisciplinary Treatment and Diagnostic Plan Update  07/24/2019 Time of Session: 9:40am Melissa Davies MRN: [id number]  Principal Diagnosis: MDD (major depressive disorder), recurrent episode, severe (HCC)  Secondary Diagnoses: Principal Problem:   MDD (major depressive disorder), recurrent episode, severe (HCC) Active Problems:   ADD (attention deficit disorder)   Anxiety state   Depression, reactive   PTSD (post-traumatic stress disorder)   Suicidal ideation   Major depressive disorder, recurrent episode (HCC)   Current Medications:  Current Facility-Administered Medications  Medication Dose Route Frequency Provider Last Rate Last Admin  . acetaminophen (TYLENOL) tablet 650 mg  650 mg Oral Q6H PRN Nwoko, Agnes I, NP      . alum & mag hydroxide-simeth (MAALOX/MYLANTA) 200-200-20 MG/5ML suspension 30 mL  30 mL Oral Q4H PRN Nwoko, Agnes I, NP      . buPROPion (WELLBUTRIN XL) 24 hr tablet 150 mg  150 mg Oral Daily Nwoko, Nicole Kindred I, NP   150 mg at 07/24/19 0858  . FLUoxetine (PROZAC) capsule 20 mg  20 mg Oral Daily Armandina Stammer I, NP   20 mg at 07/24/19 0857  . gabapentin (NEURONTIN) capsule 300 mg  300 mg Oral TID Armandina Stammer I, NP   300 mg at 07/24/19 0857  . magnesium hydroxide (MILK OF MAGNESIA) suspension 30 mL  30 mL Oral Daily PRN Nwoko, Agnes I, NP      . melatonin tablet 3 mg  3 mg Oral QHS Nwoko, Agnes I, NP   3 mg at 07/23/19 2104  . traZODone (DESYREL) tablet 100 mg  100 mg Oral QHS PRN Gillermo Murdoch, NP       PTA Medications: No medications prior to admission.    Patient Stressors: Health problems Marital or family conflict Medication change or noncompliance  Patient Strengths: Geographical information systems officer for treatment/growth  Treatment Modalities: Medication Management, Group therapy, Case management,  1 to 1 session with clinician, Psychoeducation, Recreational therapy.   Physician Treatment Plan for Primary Diagnosis: MDD (major  depressive disorder), recurrent episode, severe (HCC) Long Term Goal(s): Improvement in symptoms so as ready for discharge Improvement in symptoms so as ready for discharge   Short Term Goals: Ability to identify changes in lifestyle to reduce recurrence of condition will improve Ability to verbalize feelings will improve Ability to demonstrate self-control will improve Ability to identify and develop effective coping behaviors will improve Compliance with prescribed medications will improve Ability to identify triggers associated with substance abuse/mental health issues will improve  Medication Management: Evaluate patient's response, side effects, and tolerance of medication regimen.  Therapeutic Interventions: 1 to 1 sessions, Unit Group sessions and Medication administration.  Evaluation of Outcomes: Adequate for Discharge  Physician Treatment Plan for Secondary Diagnosis: Principal Problem:   MDD (major depressive disorder), recurrent episode, severe (HCC) Active Problems:   ADD (attention deficit disorder)   Anxiety state   Depression, reactive   PTSD (post-traumatic stress disorder)   Suicidal ideation   Major depressive disorder, recurrent episode (HCC)  Long Term Goal(s): Improvement in symptoms so as ready for discharge Improvement in symptoms so as ready for discharge   Short Term Goals: Ability to identify changes in lifestyle to reduce recurrence of condition will improve Ability to verbalize feelings will improve Ability to demonstrate self-control will improve Ability to identify and develop effective coping behaviors will improve Compliance with prescribed medications will improve Ability to identify triggers associated with substance abuse/mental health issues will improve     Medication Management: Evaluate patient's response,  side effects, and tolerance of medication regimen.  Therapeutic Interventions: 1 to 1 sessions, Unit Group sessions and Medication  administration.  Evaluation of Outcomes: Adequate for Discharge   RN Treatment Plan for Primary Diagnosis: MDD (major depressive disorder), recurrent episode, severe ([place]) Long Term Goal(s): Knowledge of disease and therapeutic regimen to maintain health will improve  Short Term Goals: Ability to disclose and discuss suicidal ideas, Ability to identify and develop effective coping behaviors will improve and Compliance with prescribed medications will improve  Medication Management: RN will administer medications as ordered by provider, will assess and evaluate patient's response and provide education to patient for prescribed medication. RN will report any adverse and/or side effects to prescribing provider.  Therapeutic Interventions: 1 on 1 counseling sessions, Psychoeducation, Medication administration, Evaluate responses to treatment, Monitor vital signs and CBGs as ordered, Perform/monitor CIWA, COWS, AIMS and Fall Risk screenings as ordered, Perform wound care treatments as ordered.  Evaluation of Outcomes: Adequate for Discharge   LCSW Treatment Plan for Primary Diagnosis: MDD (major depressive disorder), recurrent episode, severe ([place]) Long Term Goal(s): Safe transition to appropriate next level of care at discharge, Engage patient in therapeutic group addressing interpersonal concerns.  Short Term Goals: Engage patient in aftercare planning with referrals and resources  Therapeutic Interventions: Assess for all discharge needs, 1 to 1 time with Social worker, Explore available resources and support systems, Assess for adequacy in community support network, Educate family and significant other(s) on suicide prevention, Complete Psychosocial Assessment, Interpersonal group therapy.  Evaluation of Outcomes: Adequate for Discharge   Progress in Treatment: Attending groups: Yes. Participating in groups: Yes. Taking medication as prescribed: Yes. Toleration medication:  Yes. Family/Significant other contact made: Yes, individual(s) contacted:  the patient's husband Patient understands diagnosis: Yes. Discussing patient identified problems/goals with staff: Yes. Medical problems stabilized or resolved: Yes. Denies suicidal/homicidal ideation: Yes. Issues/concerns per patient self-inventory: No. Other:   New problem(s) identified: None   New Short Term/Long Term Goal(s): medication stabilization, elimination of SI thoughts, development of comprehensive mental wellness plan.    Patient Goals: "Getting the proper medications so I can live a normal life"   Discharge Plan or Barriers: Patient plans to return home with her spouse. She plans to follow up with [facility] for medication management and therapy services.   Reason for Continuation of Hospitalization: None   Estimated Length of Stay: Discharge, 07/24/2019  Attendees: Patient: Melissa Davies  07/24/2019 10:54 AM  Physician: Dr. Myles Lipps, MD 07/24/2019 10:54 AM  Nursing:  07/24/2019 10:54 AM  RN Care Manager: 07/24/2019 10:54 AM  Social Worker: Radonna Ricker, LCSW 07/24/2019 10:54 AM  Recreational Therapist:  07/24/2019 10:54 AM  Other:  07/24/2019 10:54 AM  Other:  07/24/2019 10:54 AM  Other: 07/24/2019 10:54 AM    Scribe for Treatment Team: Marylee Floras, [place] 07/24/2019 10:54 AM

## 2019-07-24 NOTE — Progress Notes (Signed)
  [facility] Adult Case Management Discharge Plan :  Will you be returning to the same living situation after discharge:  Yes,  patient is returning home with her spouse At discharge, do you have transportation home?: Yes,  patient's spouse is picking her up Do you have the ability to pay for your medications: No.  Release of information consent forms completed and in the chart;  Patient's signature needed at discharge.  Patient to Follow up at: Follow-up Information    [facility] [employer]. Go to.   Specialty: Professional Counselor Why: To re-establish services for medication management and therapy services please go to agency during walk-in hours. Walk-in hours are Monday-Friday from 8:30a-12:00p and 1:00p-2:30p. Be sure to bring Photo ID, insurance information and any discharge papers Contact information: [facility] [address] [place] [address] [phone]           Next level of care provider has access to [facility] Link:yes  Safety Planning and Suicide Prevention discussed: Yes,  with the patient's spouse  Have you used any form of tobacco in the last 30 days? (Cigarettes, Smokeless Tobacco, Cigars, and/or Pipes): Yes  Has patient been referred to the Quitline?: Patient refused referral  Patient has been referred for addiction treatment: N/A  Maeola Sarah, LCSWA 07/24/2019, 10:10 AM

## 2019-09-28 ENCOUNTER — Other Ambulatory Visit: Payer: Self-pay

## 2019-09-28 ENCOUNTER — Encounter (HOSPITAL_COMMUNITY): Payer: Self-pay | Admitting: Psychiatric/Mental Health

## 2019-09-28 ENCOUNTER — Telehealth (INDEPENDENT_AMBULATORY_CARE_PROVIDER_SITE_OTHER): Payer: No Payment, Other | Admitting: Psychiatric/Mental Health

## 2019-09-28 DIAGNOSIS — F411 Generalized anxiety disorder: Secondary | ICD-10-CM

## 2019-09-28 DIAGNOSIS — F902 Attention-deficit hyperactivity disorder, combined type: Secondary | ICD-10-CM

## 2019-09-28 DIAGNOSIS — F332 Major depressive disorder, recurrent severe without psychotic features: Secondary | ICD-10-CM

## 2019-09-28 NOTE — Progress Notes (Addendum)
Psychiatric Initial Adult Assessment   Virtual Visit via Video Note   I connected with  Nikira on 09/28/19 by a video enabled telemedicine application and verified that I am speaking with the correct person using two identifiers.   Location: Patient: Home Provider: [place] home office   I discussed the limitations of evaluation and management by telemedicine and the availability of in person appointments. The patient expressed understanding and agreed to proceed.   I provided 60 minutes of non-face-to-face time during this encounter.   Patient Identification: Jonah Nestle MRN:  [id number] Date of Evaluation:  09/28/2019 Referral Source: [facility] Chief Complaint:  "I'm so stressed out" Visit Diagnosis:    ICD-10-CM   1. Attention deficit hyperactivity disorder (ADHD), combined type  F90.2   2. Anxiety state  F41.1   3. Severe episode of recurrent major depressive disorder, without psychotic features ([place])  F33.2     History of Present Illness: Rehema is a 49 year old female seen today for initial psych evaluation.  Patient was referred to outpatient psychiatry by [facility] for medication management. On assessment, Sherisse reports that she has been having a really tough time with life.  She says that her husband loss part of his leg in a work accident. She also reports that she hasnt working for a long time and as a result, they are in the process of losing their house.  She was hospitalized in April for suicidal ideations she stated that she felt like she just wanted to give up.  Upon discharge she was prescribed Prozac 20 mg daily, trazodone 100 mg, and Wellbutrin XL 150 mg. She says her precription was recently changed  at [facility] in which they d/c'd Prozac and prescribed  tegretol 300 mg tablet twice a day. She says she has been experiencing some negative side effects such as lightheadedness and "wobbly walk". She also says they are starting to subside. Medications have already  been refilled by previous provider.  No medications changes have been made on this visit as other med changes were just 3 weeks ago.  Patient has agreed to follow-up in 4 to reevaluate.  Associated Signs/Symptoms: Depression Symptoms:  depressed mood, psychomotor agitation, difficulty concentrating, anxiety, panic attacks, (Hypo) Manic Symptoms:  Elevated Mood, Irritable Mood, Labiality of Mood, Anxiety Symptoms:  Excessive Worry, Social Anxiety, Psychotic Symptoms:  Paranoia, PTSD Symptoms: NA  Past Psychiatric History: MDD, BPD, Histrionic PD  Previous Psychotropic Medications: No   Substance Abuse History in the last 12 months:  No.  Consequences of Substance Abuse: NA  Past Medical History:  Past Medical History:  Diagnosis Date  . Anxiety     Past Surgical History:  Procedure Laterality Date  . TUBAL LIGATION      Family Psychiatric History: unknown  Family History:  Family History  Problem Relation Age of Onset  . ADD / ADHD Son     Social History:   Social History   Socioeconomic History  . Marital status: Married    Spouse name: Not on file  . Number of children: Not on file  . Years of education: Not on file  . Highest education level: Not on file  Occupational History  . Occupation: alterations  Tobacco Use  . Smoking status: Current Some Day Smoker    Years: 1.00    Types: Cigarettes  . Smokeless tobacco: Never Used  Substance and Sexual Activity  . Alcohol use: Not Currently    Comment: occasional   . Drug use:  Yes    Types: Marijuana  . Sexual activity: Yes    Partners: Male    Birth control/protection: None  Other Topics Concern  . 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 Difficulty of Paying Living Expenses:   Food Insecurity:   . Worried About Programme researcher, broadcasting/film/video in the Last Year:   . Occupational psychologist in the Last Year:   Transportation Needs:   . Freight forwarder (Medical):   Marland Kitchen Lack of Transportation (Non-Medical):   Physical Activity:   . Days of Exercise per Week:   . Minutes of Exercise per Session:   Stress:   . Feeling of Stress :   Social Connections:   . Frequency of Communication with Friends and Family:   . Frequency of Social Gatherings with Friends and Family:   . Attends Religious Services:   . Active Member of Clubs or Organizations:   . Attends Banker Meetings:   Marland Kitchen Marital Status:     Additional Social History: Lives with husband who recently suffered an accident at work where part of his leg was amputated.  Allergies:   Allergies  Allergen Reactions  . Klonopin [Clonazepam] Rash    On face    Metabolic Disorder Labs: No results found for: HGBA1C, MPG No results found for: PROLACTIN No results found for: CHOL, TRIG, HDL, CHOLHDL, VLDL, LDLCALC No results found for: TSH  Therapeutic Level Labs: No results found for: LITHIUM No results found for: CBMZ No results found for: VALPROATE  Current Medications: Current Outpatient Medications  Medication Sig Dispense Refill  . buPROPion (WELLBUTRIN XL) 150 MG 24 hr tablet Take 1 tablet (150 mg total) by mouth daily. 30 tablet 0  . FLUoxetine (PROZAC) 20 MG capsule Take 1 capsule (20 mg total) by mouth daily. 30 capsule 0  . gabapentin (NEURONTIN) 300 MG capsule Take 1 capsule (300 mg total) by mouth 3 (three) times daily. 90 capsule 0  . melatonin 3 MG TABS tablet Take 1 tablet (3 mg total) by mouth at bedtime. 30 tablet 0  . traZODone (DESYREL) 100 MG tablet Take 1 tablet (100 mg total) by mouth at bedtime as needed for sleep. 30 tablet 0   No current facility-administered medications for this visit.    Musculoskeletal: Strength & Muscle Tone: within normal limits Gait & Station: normal Patient leans: N/A  Psychiatric Specialty Exam: Review of Systems  There were no  vitals taken for this visit.There is no height or weight on file to calculate BMI.  General Appearance: Casual  Eye Contact:  Good  Speech:  Clear and Coherent  Volume:  Normal  Mood:  Anxious, Depressed and Hopeless  Affect:  Congruent and Tearful  Thought Process:  Coherent and Descriptions of Associations: Intact  Orientation:  Full (Time, Place, and Person)  Thought Content:  Logical  Suicidal Thoughts:  No  Homicidal Thoughts:  No  Memory:  Immediate;   Fair  Judgement:  Intact  Insight:  Fair  Psychomotor Activity:  Normal  Concentration:  Attention Span: Good  Recall:  Good  Fund of Knowledge:Good  Language: Good  Akathisia:  NA  Handed:  Right  AIMS (if indicated):  not done  Assets:  Communication Skills Desire for Improvement Financial Resources/Insurance Social Support Transportation  ADL's:  Intact  Cognition:  WNL  Sleep:  Poor   Screenings: AIMS     [facility]  AIMS Total Score 0    AUDIT     [facility]  Alcohol Use Disorder Identification Test Final Score (AUDIT) 0    PHQ2-9     Office Visit from 08/25/2016 in Primary Care at [facility] Visit from 06/02/2016 in Primary Care at [facility] Visit from 09/28/2015 in Primary Care at [facility] Visit from 07/12/2013 in Primary Care at [facility] Total Score 0 3 6 0  PHQ-9 Total Score -- 21 23 --      Assessment and Plan:Will follow-up in 4 weeks and will continue medications as prescribed.   Jearld Lesch, NP 6/17/20211:46 PM

## 2019-10-02 ENCOUNTER — Ambulatory Visit (HOSPITAL_COMMUNITY): Payer: Self-pay | Admitting: Licensed Clinical Social Worker

## 2019-10-17 ENCOUNTER — Telehealth (HOSPITAL_COMMUNITY): Payer: Self-pay | Admitting: *Deleted

## 2019-10-17 NOTE — Telephone Encounter (Signed)
Walk in asking to speak with nurse re her medications. She believes she is having side affects from the Trileptal based on her own research. Her SE are wobbly lets, negative thoughts of SI, and wants to know if she can just stop it. Called [facility] our pharmacist and since patient is not taking it for a seizure disorder she can stop it. She had it prescribed by [facility] provider as is all of her meds, but she denies it being for seizures, reviewed her health hx and no hx of seizures. She has many stressors in her life, esp financial with her husband losing his job secondary to losing part of his leg and foot and she quit her job to care for him. She was given reminders of both of her upcoming appts, much support and encouragement and when comes for next appt will try to assist her with the cost of her meds and she has no insurance or income. In conversation also with her provider Ms Durwin Nora re patient concerns.

## 2019-10-25 ENCOUNTER — Other Ambulatory Visit: Payer: Self-pay

## 2019-10-25 ENCOUNTER — Telehealth (INDEPENDENT_AMBULATORY_CARE_PROVIDER_SITE_OTHER): Payer: No Payment, Other | Admitting: Psychiatric/Mental Health

## 2019-10-25 ENCOUNTER — Ambulatory Visit (INDEPENDENT_AMBULATORY_CARE_PROVIDER_SITE_OTHER): Payer: No Payment, Other | Admitting: Licensed Clinical Social Worker

## 2019-10-25 DIAGNOSIS — F410 Panic disorder [episodic paroxysmal anxiety] without agoraphobia: Secondary | ICD-10-CM | POA: Diagnosis not present

## 2019-10-25 DIAGNOSIS — F332 Major depressive disorder, recurrent severe without psychotic features: Secondary | ICD-10-CM | POA: Diagnosis not present

## 2019-10-25 DIAGNOSIS — F3130 Bipolar disorder, current episode depressed, mild or moderate severity, unspecified: Secondary | ICD-10-CM

## 2019-10-25 DIAGNOSIS — F411 Generalized anxiety disorder: Secondary | ICD-10-CM | POA: Diagnosis not present

## 2019-10-25 DIAGNOSIS — Z6836 Body mass index (BMI) 36.0-36.9, adult: Secondary | ICD-10-CM

## 2019-10-25 DIAGNOSIS — F4310 Post-traumatic stress disorder, unspecified: Secondary | ICD-10-CM | POA: Diagnosis not present

## 2019-10-25 MED ORDER — DIVALPROEX SODIUM 250 MG PO DR TAB: 250.0000 mg | DELAYED_RELEASE_TABLET | Freq: Two times a day (BID) | ORAL | 2 refills | Status: DC

## 2019-10-25 MED ORDER — RISPERIDONE 1 MG PO TABS: 1.0000 mg | ORAL_TABLET | Freq: Every day | ORAL | 2 refills | Status: DC

## 2019-10-25 MED ORDER — CLONAZEPAM 0.5 MG PO TABS: 0.5000 mg | ORAL_TABLET | Freq: Three times a day (TID) | ORAL | 2 refills | Status: DC | PRN

## 2019-10-25 MED FILL — DIVALPROEX SOD DR 250 MG TA: 250 | 30 days supply | Qty: 60 | Fill #0

## 2019-10-25 MED FILL — risperiDONE 1 MG TABS: 1 | 30 days supply | Qty: 30 | Fill #0
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
## 2019-10-30 ENCOUNTER — Encounter (HOSPITAL_COMMUNITY): Payer: Self-pay

## 2019-10-30 ENCOUNTER — Encounter (HOSPITAL_COMMUNITY): Payer: Self-pay | Admitting: Psychiatric/Mental Health

## 2019-10-30 ENCOUNTER — Ambulatory Visit
Admission: EM | Admit: 2019-10-30 | Discharge: 2019-10-30 | Disposition: A | Discharge status: Home / Self Care | Payer: Self-pay

## 2019-10-30 ENCOUNTER — Telehealth: Payer: Self-pay

## 2019-10-30 ENCOUNTER — Emergency Department (HOSPITAL_COMMUNITY)
Admission: EM | Admit: 2019-10-30 | Discharge: 2019-10-31 | Disposition: A | Discharge status: Home / Self Care | Payer: Self-pay | Attending: Emergency Medicine | Admitting: Emergency Medicine

## 2019-10-30 ENCOUNTER — Inpatient Hospital Stay
Admission: RE | Admit: 2019-10-30 | Discharge: 2019-10-30 | Disposition: A | Discharge status: Home / Self Care | Payer: Self-pay | Source: Ambulatory Visit

## 2019-10-30 ENCOUNTER — Other Ambulatory Visit: Payer: Self-pay

## 2019-10-30 DIAGNOSIS — M47812 Spondylosis without myelopathy or radiculopathy, cervical region: Secondary | ICD-10-CM | POA: Insufficient documentation

## 2019-10-30 DIAGNOSIS — F17210 Nicotine dependence, cigarettes, uncomplicated: Secondary | ICD-10-CM | POA: Insufficient documentation

## 2019-10-30 NOTE — ED Notes (Signed)
When asked what brings patient to [facility] today, patient states she would like an MRI or CT of her neck.  This RN explained we did not have those capabilities here, but our provider would be happy to see her and assess her.  Patient states she would like imaging done, and this RN explained she could see if the ER would be able to do her scans, but I could not guarantee it.  Patient states she will report to the ER.  Will d/c.

## 2019-10-30 NOTE — ED Triage Notes (Signed)
Pt c/o possible slipped disk, intermittent back pain that leads to numbness/tingling x2 months. No neuro deficits in triage. Triage note written by RN under NT by accident,

## 2019-10-30 NOTE — Progress Notes (Signed)
BH MD/PA/NP OP Progress Note  10/30/2019 1:12 AM Melissa Davies  MRN:  [id number]  Chief Complaint:  "I'm so anxious and nervous, I need to get it together" HPI: Patient presents today for routine follow-up appointment for med management.  On assessment Melissa Davies reports that she has been extremely anxious, extremely depressed, and having several panic attacks.  She states that she cannot stop her mind from racing, and that her friend has provided a list of symptoms for her to report to Clinical research associate today.  The list states that Melissa Davies has been "bouncing off the walls, sleeps for 2 hours a night or less, has racing thoughts, is eating less but feels as though she is gaining weight, states that she has been very paranoid, and has to build up coverage just to leave the house".  Melissa Davies reports her husband's lack of work has been a tremendous amount of stress.  She reports guilty feelings for having anxiety and panic attacks and feels as though she is less supportive of her husband than she should be.  She states she feels like she has the weight of the world on her shoulders and she cannot understand why.  She presents today very anxious very jittery with just an overall unrestful state.  Her speech is pressured and her thoughts are often incomplete as she goes from one topic to the next.  Melissa Davies's is previous medication regimen included Wellbutrin 150 mg 24-hour tablet taken daily, Prozac 20 mg to be taken daily, and gabapentin 300 mg to be taken 3 times a day.  Melissa Davies states that this medication regimen is not working.  Writer to prescribe Depakote 250 mg to be taken 2 times daily, risperidone 1 mg to be taken at bedtime, and Klonopin 0.5 mg to be taken 3 times a day as needed for anxiety/panic attacks.  Melissa Davies agrees to new medication regimen and has agreed to follow-up in 6 weeks to evaluate medication efficacy.   Visit Diagnosis:    ICD-10-CM   1. Bipolar I disorder, most recent episode depressed  (HCC)  F31.30 divalproex (DEPAKOTE) 250 MG DR tablet    risperiDONE (RISPERDAL) 1 MG tablet    DISCONTINUED: risperiDONE (RISPERDAL) 1 MG tablet    DISCONTINUED: divalproex (DEPAKOTE) 250 MG DR tablet  2. PTSD (post-traumatic stress disorder)  F43.10 clonazePAM (KLONOPIN) 0.5 MG tablet    DISCONTINUED: clonazePAM (KLONOPIN) 0.5 MG tablet  3. Severe episode of recurrent major depressive disorder, without psychotic features (HCC)  F33.2   4. Anxiety state  F41.1   5. BMI 36.0-36.9,adult  Z68.36     Past Psychiatric History: Bipolar disorder, PTSD, MDD, GAD  Past Medical History:  Past Medical History:  Diagnosis Date  . Anxiety     Past Surgical History:  Procedure Laterality Date  . TUBAL LIGATION      Family Psychiatric History: unknown  Family History:  Family History  Problem Relation Age of Onset  . ADD / ADHD Son     Social History:  Social History   Socioeconomic History  . Marital status: Married    Spouse name: Not on file  . Number of children: Not on file  . Years of education: Not on file  . Highest education level: Not on file  Occupational History  . Occupation: alterations  Tobacco Use  . Smoking status: Current Some Day Smoker    Years: 1.00    Types: Cigarettes  . Smokeless tobacco: Never Used  Substance and Sexual Activity  . Alcohol  use: Not Currently    Comment: occasional   . Drug use: Yes    Types: Marijuana  . Sexual activity: Yes    Partners: Male    Birth control/protection: None  Other Topics Concern  . 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 Difficulty of Paying Living Expenses:   Food Insecurity:   . Worried About Programme researcher, broadcasting/film/video in the Last Year:   . Barista in the Last Year:   Transportation Needs:   . Freight forwarder (Medical):   Marland Kitchen Lack of Transportation  (Non-Medical):   Physical Activity:   . Days of Exercise per Week:   . Minutes of Exercise per Session:   Stress:   . Feeling of Stress :   Social Connections:   . Frequency of Communication with Friends and Family:   . Frequency of Social Gatherings with Friends and Family:   . Attends Religious Services:   . Active Member of Clubs or Organizations:   . Attends Banker Meetings:   Marland Kitchen Marital Status:     Allergies:  Allergies  Allergen Reactions  . Klonopin [Clonazepam] Rash    On face    Metabolic Disorder Labs: No results found for: HGBA1C, MPG No results found for: PROLACTIN No results found for: CHOL, TRIG, HDL, CHOLHDL, VLDL, LDLCALC No results found for: TSH  Therapeutic Level Labs: No results found for: LITHIUM No results found for: VALPROATE No components found for:  CBMZ  Current Medications: Current Outpatient Medications  Medication Sig Dispense Refill  . buPROPion (WELLBUTRIN XL) 150 MG 24 hr tablet Take 1 tablet (150 mg total) by mouth daily. 30 tablet 0  . clonazePAM (KLONOPIN) 0.5 MG tablet Take 1 tablet (0.5 mg total) by mouth 3 (three) times daily as needed for anxiety. 90 tablet 2  . divalproex (DEPAKOTE) 250 MG DR tablet Take 1 tablet (250 mg total) by mouth 2 (two) times daily. 60 tablet 2  . FLUoxetine (PROZAC) 20 MG capsule Take 1 capsule (20 mg total) by mouth daily. 30 capsule 0  . gabapentin (NEURONTIN) 300 MG capsule Take 1 capsule (300 mg total) by mouth 3 (three) times daily. 90 capsule 0  . melatonin 3 MG TABS tablet Take 1 tablet (3 mg total) by mouth at bedtime. 30 tablet 0  . risperiDONE (RISPERDAL) 1 MG tablet Take 1 tablet (1 mg total) by mouth at bedtime. 30 tablet 2  . traZODone (DESYREL) 100 MG tablet Take 1 tablet (100 mg total) by mouth at bedtime as needed for sleep. 30 tablet 0   No current facility-administered medications for this visit.     Musculoskeletal: Strength & Muscle Tone: within normal limits Gait &  Station: normal Patient leans: N/A  Psychiatric Specialty Exam: Review of Systems  Psychiatric/Behavioral: Positive for behavioral problems, decreased concentration, dysphoric mood and sleep disturbance. The patient is nervous/anxious and is hyperactive.   All other systems reviewed and are negative.   There were no vitals taken for this visit.There is no height or weight on file to calculate BMI.  General Appearance: Casual  Eye Contact:  Fair  Speech:  Pressured  Volume:  Normal  Mood:  Anxious and Depressed  Affect:  Congruent and Depressed  Thought Process:  Coherent and Descriptions of Associations: Intact  Orientation:  Full (Time,  Place, and Person)  Thought Content: Illogical   Suicidal Thoughts:  No  Homicidal Thoughts:  No  Memory:  Immediate;   Good  Judgement:  Fair  Insight:  Fair  Psychomotor Activity:  Normal  Concentration:  Attention Span: Fair  Recall:  [employer] of Knowledge: Fair  Language: Fair  Akathisia:  NA  Handed:  Right  AIMS (if indicated): not done  Assets:  Communication Skills Desire for Improvement Housing Social Support  ADL's:  Intact  Cognition: WNL  Sleep:  Poor   Screenings: AIMS     [facility]  AIMS Total Score 0    AUDIT     [facility]  Alcohol Use Disorder Identification Test Final Score (AUDIT) 0    PHQ2-9     Office Visit from 08/25/2016 in Primary Care at [facility] Visit from 06/02/2016 in Primary Care at [facility] Visit from 09/28/2015 in Primary Care at [facility] Visit from 07/12/2013 in Primary Care at [facility] Total Score 0 3 6 0  PHQ-9 Total Score -- 21 23 --       Assessment and Plan: Writer to prescribe Depakote 250 mg to be taken 2 times daily, risperidone 1 mg to be taken at bedtime, and Klonopin 0.5 mg to be taken 3 times a day as needed for  anxiety/panic attacks.  Leiyah agrees to new medication regimen and has agreed to follow-up in 6 weeks to evaluate medication efficacy.    ICD-10-CM   1. Bipolar I disorder, most recent episode depressed (HCC)  F31.30 divalproex (DEPAKOTE) 250 MG DR tablet    risperiDONE (RISPERDAL) 1 MG tablet    DISCONTINUED: risperiDONE (RISPERDAL) 1 MG tablet    DISCONTINUED: divalproex (DEPAKOTE) 250 MG DR tablet  2. PTSD (post-traumatic stress disorder)  F43.10 clonazePAM (KLONOPIN) 0.5 MG tablet    DISCONTINUED: clonazePAM (KLONOPIN) 0.5 MG tablet  3. Severe episode of recurrent major depressive disorder, without psychotic features (HCC)  F33.2   4. Anxiety state  F41.1   5. BMI 36.0-36.9,adult  Z68.36     Jearld Lesch, NP 10/30/2019, 1:12 AM

## 2019-10-30 NOTE — ED Triage Notes (Signed)
Pt c/o possible slipped disk, intermittent back pain that leads to numbness/tingling down R arm x2 mo. No neuro deficits in triage.

## 2019-10-31 ENCOUNTER — Emergency Department (HOSPITAL_COMMUNITY): Payer: Self-pay

## 2019-10-31 MED ORDER — ACETAMINOPHEN 325 MG PO TABS
650.0000 mg | ORAL_TABLET | Freq: Once | ORAL | Status: AC
  Administered 2019-10-31: 650 mg via ORAL
  Filled 2019-10-31: qty 2

## 2019-10-31 MED ORDER — METHOCARBAMOL 500 MG PO TABS: 500.0000 mg | ORAL_TABLET | Freq: Two times a day (BID) | ORAL | 0 refills | Status: DC

## 2019-10-31 MED ORDER — LIDOCAINE 5 % EX PTCH: 1.0000 | MEDICATED_PATCH | CUTANEOUS | 0 refills | Status: DC

## 2019-10-31 NOTE — ED Notes (Signed)
Pt requesting some tylenol for pain

## 2019-10-31 NOTE — ED Notes (Signed)
Pt step outside 

## 2019-10-31 NOTE — ED Provider Notes (Signed)
[facility] Provider Note   CSN: [id number] Arrival date & time: 10/30/19  1834     History Chief Complaint  Patient presents with  . Numbness  . Back Pain    Melissa Davies is a 49 y.o. female with a history of ADD, anxiety, PTSD, borderline personality disorder, and bipolar 1 disorder who presents to the emergency department with a chief complaint of neck pain.  The patient reports that many years ago she fell down a flight of stairs and hit the back of her neck.  She reports that for the last 1 to 2 years she has been having pain in her neck.  She reports that the pain will come and go.  The pain is sharp.  For months, she now feels as if the pain is now spreading to the bilateral sides of her neck and is achy.  No known aggravating or alleviating factors.  She notes that sometimes when she presses on her spine that she will feel a little "pea" pop out intermittently.  She denies any recent trauma or injuries.  She reports that she has been having intermittent tingling and numbness in her bilateral hands for many months.  Symptoms have not been worsening since onset.  She denies weakness, visual changes, urinary or fecal incontinence, chest pain, shortness of breath, cough, fever, chills, neck stiffness.  She reports that her symptoms have not worsened, but she finally has time to come and get it checked out where she is started on a what is causing her symptoms.  She reports a significant amount of increasing stress after her husband was injured at work.  She reports that there is a lien against her house and she has been having financial concerns because [employer] will pay for his injury.  She reports that her job is given her a 13-week leave of absence to get her affairs in order as she has been the primary caregiver for her husband.  She recently establish care with a therapist as she used to be on Ritalin for ADD and has since had  multiple diagnoses including bipolar disorder, PTSD, borderline personality disorder, and anxiety, and has had her medications adjusted multiple times.  She also reports that she has been having more agoraphobia and has been avoiding public places.  Reports that she has been avoiding going into the grocery store at times and has been having increasing panic attacks.  She was seen by psychiatry earlier this week.  No SI, HI, or auditory or visual hallucinations.  She reports that she has taken some of her husband's Robaxin for her symptoms in her neck with some improvement.  No other treatment prior to arrival.  She is not established with neurosurgery.  The history is provided by the patient. No language interpreter was used.       Past Medical History:  Diagnosis Date  . Anxiety     Patient Active Problem List   Diagnosis Date Noted  . Bipolar I disorder, most recent episode depressed (HCC) 10/25/2019  . MDD (major depressive disorder), recurrent episode, severe (HCC) 07/22/2019  . Major depressive disorder, recurrent episode (HCC) 07/22/2019  . Suicidal ideation 07/21/2019  . PTSD (post-traumatic stress disorder) 09/13/2013  . Depression, reactive 05/24/2013  . ADD (attention deficit disorder) 09/26/2012  . Anxiety state 09/26/2012  . BMI 36.0-36.9,adult 09/26/2012    Past Surgical History:  Procedure Laterality Date  . TUBAL LIGATION       OB  History   No obstetric history on file.     Family History  Problem Relation Age of Onset  . ADD / ADHD Son     Social History   Tobacco Use  . Smoking status: Current Some Day Smoker    Years: 1.00    Types: Cigarettes  . Smokeless tobacco: Never Used  Substance Use Topics  . Alcohol use: Not Currently    Comment: occasional   . Drug use: Yes    Types: Marijuana    Home Medications Prior to Admission medications   Medication Sig Start Date End Date Taking? Authorizing Provider  acetaminophen (TYLENOL) 325 MG tablet  Take 650 mg by mouth every 6 (six) hours as needed for mild pain or headache.   Yes [provider]  buPROPion (WELLBUTRIN XL) 150 MG 24 hr tablet Take 1 tablet (150 mg total) by mouth daily. 07/25/19  Yes Aldean Baker, NP  clonazePAM (KLONOPIN) 0.5 MG tablet Take 1 tablet (0.5 mg total) by mouth 3 (three) times daily as needed for anxiety. 10/25/19 01/23/20 Yes Dixon, Elray Buba, NP  divalproex (DEPAKOTE) 250 MG DR tablet Take 1 tablet (250 mg total) by mouth 2 (two) times daily. 10/25/19 01/23/20 Yes Dixon, Elray Buba, NP  gabapentin (NEURONTIN) 300 MG capsule Take 1 capsule (300 mg total) by mouth 3 (three) times daily. 07/24/19  Yes Aldean Baker, NP  loratadine (CLARITIN) 10 MG tablet Take 10 mg by mouth daily as needed for allergies.   Yes [provider]  risperiDONE (RISPERDAL) 1 MG tablet Take 1 tablet (1 mg total) by mouth at bedtime. 10/25/19 01/23/20 Yes Dixon, Elray Buba, NP  traZODone (DESYREL) 100 MG tablet Take 1 tablet (100 mg total) by mouth at bedtime as needed for sleep. 07/24/19  Yes Aldean Baker, NP  lidocaine (LIDODERM) 5 % Place 1 patch onto the skin daily. Remove & Discard patch within 12 hours or as directed by MD 10/31/19   Jazmin Ley A, PA-C  methocarbamol (ROBAXIN) 500 MG tablet Take 1 tablet (500 mg total) by mouth 2 (two) times daily. 10/31/19   Tahiry Spicer A, PA-C  FLUoxetine (PROZAC) 20 MG capsule Take 1 capsule (20 mg total) by mouth daily. Patient not taking: Reported on 10/31/2019 07/25/19 10/31/19  Aldean Baker, NP    Allergies    Klonopin [clonazepam]  Review of Systems   Review of Systems  Constitutional: Negative for activity change, chills and fever.  HENT: Negative for congestion and sore throat.   Respiratory: Negative for shortness of breath.   Cardiovascular: Negative for chest pain.  Gastrointestinal: Negative for abdominal pain, diarrhea, nausea and vomiting.  Genitourinary: Negative for dysuria.  Musculoskeletal: Positive for  arthralgias, myalgias and neck pain. Negative for back pain and neck stiffness.  Skin: Negative for rash.  Allergic/Immunologic: Negative for immunocompromised state.  Neurological: Negative for dizziness, seizures, syncope, weakness and headaches.  Psychiatric/Behavioral: Negative for confusion.   Physical Exam Updated Vital Signs BP 110/74 (BP Location: Right Arm)   Pulse 75   Temp 98.2 F (36.8 C) (Oral)   Resp 18   Ht 5' (1.524 m)   Wt 81.6 kg   SpO2 100%   BMI 35.15 kg/m   Physical Exam Vitals and nursing note reviewed.  Constitutional:      General: She is not in acute distress.    Comments: Nontoxic-appearing.  No acute distress.  HENT:     Head: Normocephalic.  Eyes:     Conjunctiva/sclera:  Conjunctivae normal.  Cardiovascular:     Rate and Rhythm: Normal rate and regular rhythm.     Heart sounds: No murmur heard.  No friction rub. No gallop.   Pulmonary:     Effort: Pulmonary effort is normal. No respiratory distress.     Breath sounds: No stridor. No wheezing, rhonchi or rales.  Chest:     Chest wall: No tenderness.  Abdominal:     General: There is no distension.     Palpations: Abdomen is soft.  Musculoskeletal:     Cervical back: Neck supple.     Comments: Full active and passive range of motion of the cervical spine without pain.  She has some mild tenderness to palpation to C7.  No crepitus, step-offs, or deformities.  She also has diffuse tenderness to palpation to the bilateral trapezius muscles.  No meningismus.  She is neurovascular intact throughout the bilateral upper extremities.  Skin:    General: Skin is warm.     Findings: No rash.  Neurological:     Mental Status: She is alert.  Psychiatric:        Behavior: Behavior normal.     ED Results / Procedures / Treatments   Labs (all labs ordered are listed, but only abnormal results are displayed) Labs Reviewed - No data to display  EKG None  Radiology DG Cervical Spine  Complete  Result Date: 10/31/2019 CLINICAL DATA:  Chronic neck pain. EXAM: CERVICAL SPINE - COMPLETE 4+ VIEW COMPARISON:  None. FINDINGS: Normal alignment of the cervical vertebral bodies. Vertebral bodies and disc spaces are fairly well maintained. There are mild degenerative changes noted at C6-7 with slight disc space narrowing and anterior marginal osteophytes. The facets are normally aligned. No significant facet disease. No acute bony findings. No abnormal prevertebral soft tissue swelling. The neural foramen are grossly patent. The C1-2 articulations are maintained. The lung apices are grossly clear. IMPRESSION: 1. Mild degenerative changes at C6-7. 2. No acute bony findings. Electronically Signed   By: Rudie Meyer M.D.   On: 10/31/2019 05:57    Procedures Procedures (including critical care time)  Medications Ordered in ED Medications  acetaminophen (TYLENOL) tablet 650 mg (650 mg Oral Given 10/31/19 0143)    ED Course  I have reviewed the triage vital signs and the nursing notes.  Pertinent labs & imaging results that were available during my care of the patient were reviewed by me and considered in my medical decision making (see chart for details).    MDM Rules/Calculators/A&P                          49 year old female with a history of ADD, anxiety, PTSD, borderline personality disorder, and bipolar 1 disorder who presents to the emergency department with a chief complaint of chronic neck pain for 1 to 2 years.  She reports that she fell down a flight of stairs many years ago.  No other recent trauma or injury.  She said having intermittent paresthesias and numbness in her bilateral hands for more than 6 months.  No new symptoms and symptoms have not worsened today.  She reports that she presented today because she has finally had time to follow-up on her symptoms as she has been the primary caregiver for her husband after he was involved in an accident at work.  Vital signs are  normal.  Physical exam is reassuring.  She has no neurologic deficits. Cervical spine x-ray with mild  degenerative changes at C6-C7 with slight disc space narrowing and anterior marginal osteophytes.  No fractures noted on my read.  She was given Tylenol in the ER and reports improvement in her symptoms.  She has been following with behavioral health regarding increasing stress and agoraphobia.  No SI, HI, auditory visual hallucinations at this time.  Doubt suicidal ideation, spinal cord compression, transverse myelitis, meningitis.  Will recommend RICE therapy and to follow-up with neurosurgery for outpatient follow-up if symptoms do not improve with conservative management.  Recommended continued follow-up with psychiatry and behavioral health.  Strict return precautions given.  She is hemodynamically stable in no acute distress.  Safe for discharge to home with outpatient follow-up as indicated.  Final Clinical Impression(s) / ED Diagnoses Final diagnoses:  Osteoarthritis of cervical spine, unspecified spinal osteoarthritis complication status    Rx / DC Orders ED Discharge Orders         Ordered    methocarbamol (ROBAXIN) 500 MG tablet  2 times daily     Discontinue  Reprint     10/31/19 0638    lidocaine (LIDODERM) 5 %  Every 24 hours     Discontinue  Reprint     10/31/19 0638           Barkley Boards, PA-C 10/31/19 [id number]    Palumbo, April, MD 10/31/19 5170

## 2019-10-31 NOTE — Discharge Instructions (Addendum)
Thank you for allowing me to care for you today in the Emergency Department.   You can call to schedule a follow-up with neurosurgery if your symptoms do not improve using the regimen above.  Your x-ray did show some very mild arthritis, but was otherwise reassuring today.  Take 650 mg of Tylenol or 600 mg of ibuprofen with food every 6 hours for pain.  You can alternate between these 2 medications every 3 hours if your pain returns.  For instance, you can take Tylenol at noon, followed by a dose of ibuprofen at 3, followed by second dose of Tylenol and 6.  Apply lidocaine patch as directed.  You can also apply an ice pack or heating pad for 15 to 20 minutes up to 3-4 times a day for the next 5 days.  Do not apply a heating pad over a lidocaine patch as this can cause burns to the skin.  You can take 1 tablet of Robaxin up to 2 times daily for muscle pain and spasms.  Do not drive until you know how this medication impacts you as it may make you drowsy.  Do not take it with other medications or substances that also may make you drowsy.  Continue to follow-up with your therapist.   Return to the emergency department if you develop new weakness, begin consistently dropping objects at your hand, become unable to walk, if you start having episodes where you pee or poop on yourself, have any changes in your vision, slurred speech, if you have thoughts of wanting to harm yourself or others, or other new, concerning symptoms.

## 2019-11-01 ENCOUNTER — Telehealth: Payer: Self-pay | Admitting: Surgery

## 2019-11-01 NOTE — Telephone Encounter (Signed)
ED CM received call from patient regarding her pharmacy not receiving her prescription.  CM review record and called pharmacy to confirm prescription were read aloud and verified to pharmacist at [facility] [phone].  No further ED CM  needs identified.

## 2019-11-08 ENCOUNTER — Other Ambulatory Visit: Payer: Self-pay

## 2019-11-08 ENCOUNTER — Ambulatory Visit (INDEPENDENT_AMBULATORY_CARE_PROVIDER_SITE_OTHER): Payer: No Payment, Other | Admitting: Licensed Clinical Social Worker

## 2019-11-08 DIAGNOSIS — F3160 Bipolar disorder, current episode mixed, unspecified: Secondary | ICD-10-CM | POA: Diagnosis not present
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
## 2019-11-20 ENCOUNTER — Encounter (HOSPITAL_COMMUNITY): Payer: Self-pay | Admitting: Psychiatry

## 2019-11-20 ENCOUNTER — Telehealth (INDEPENDENT_AMBULATORY_CARE_PROVIDER_SITE_OTHER): Payer: No Payment, Other | Admitting: Psychiatry

## 2019-11-20 ENCOUNTER — Other Ambulatory Visit: Payer: Self-pay

## 2019-11-20 ENCOUNTER — Ambulatory Visit (HOSPITAL_COMMUNITY): Payer: No Payment, Other | Admitting: Psychiatry

## 2019-11-20 DIAGNOSIS — F4310 Post-traumatic stress disorder, unspecified: Secondary | ICD-10-CM | POA: Diagnosis not present

## 2019-11-20 DIAGNOSIS — F411 Generalized anxiety disorder: Secondary | ICD-10-CM

## 2019-11-20 DIAGNOSIS — F900 Attention-deficit hyperactivity disorder, predominantly inattentive type: Secondary | ICD-10-CM | POA: Diagnosis not present

## 2019-11-20 DIAGNOSIS — F3130 Bipolar disorder, current episode depressed, mild or moderate severity, unspecified: Secondary | ICD-10-CM | POA: Diagnosis not present

## 2019-11-20 MED ORDER — GABAPENTIN 300 MG PO CAPS: 300.0000 mg | ORAL_CAPSULE | Freq: Three times a day (TID) | ORAL | 2 refills | Status: DC

## 2019-11-20 MED ORDER — BUPROPION HCL ER (XL) 150 MG PO TB24: 150.0000 mg | ORAL_TABLET | Freq: Every day | ORAL | 0 refills | Status: DC

## 2019-11-20 MED ORDER — QUETIAPINE FUMARATE 100 MG PO TABS: 100.0000 mg | ORAL_TABLET | Freq: Every day | ORAL | 2 refills | Status: DC

## 2019-11-20 MED ORDER — CLONAZEPAM 0.5 MG PO TABS: 0.2500 mg | ORAL_TABLET | Freq: Three times a day (TID) | ORAL | 1 refills | Status: DC | PRN

## 2019-11-20 MED ORDER — ATOMOXETINE HCL 40 MG PO CAPS: 40.0000 mg | ORAL_CAPSULE | Freq: Every day | ORAL | 2 refills | Status: DC
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
## 2019-11-21 ENCOUNTER — Telehealth (HOSPITAL_COMMUNITY): Payer: Self-pay | Admitting: *Deleted

## 2019-11-21 ENCOUNTER — Other Ambulatory Visit (HOSPITAL_COMMUNITY): Payer: Self-pay | Admitting: Psychiatry

## 2019-11-21 DIAGNOSIS — F411 Generalized anxiety disorder: Secondary | ICD-10-CM

## 2019-11-21 DIAGNOSIS — F4310 Post-traumatic stress disorder, unspecified: Secondary | ICD-10-CM

## 2019-11-21 MED ORDER — CLONAZEPAM 0.5 MG PO TABS: 0.2500 mg | ORAL_TABLET | Freq: Three times a day (TID) | ORAL | 2 refills | Status: DC | PRN

## 2019-11-21 NOTE — Telephone Encounter (Signed)
Rx [facility] called clonazePAM (KLONOPIN) 0.5 MG tablet. Noted that previous script states clonazePAM (KLONOPIN) 1  MG tablet .   Sig - Route: Take 0.5 tablets (0.25 mg total) by mouth 3 (three) times daily as needed for anxiety. - Oral.    PLEASE SEND NEW SCRIPT FOR  1 MG W/ SAME DIRECTIONS AS PREVIOUSLY ORDERED AS NOTED ABOVE

## 2019-11-21 NOTE — Telephone Encounter (Signed)
Order corrected and prescription sent to preferred pharmacy.

## 2019-11-29 ENCOUNTER — Telehealth (HOSPITAL_COMMUNITY): Payer: No Payment, Other

## 2019-12-04 ENCOUNTER — Ambulatory Visit (INDEPENDENT_AMBULATORY_CARE_PROVIDER_SITE_OTHER): Payer: No Payment, Other | Admitting: Psychiatry

## 2019-12-04 ENCOUNTER — Other Ambulatory Visit: Payer: Self-pay

## 2019-12-04 ENCOUNTER — Encounter (HOSPITAL_COMMUNITY): Payer: Self-pay | Admitting: Psychiatry

## 2019-12-04 DIAGNOSIS — F411 Generalized anxiety disorder: Secondary | ICD-10-CM | POA: Diagnosis not present

## 2019-12-04 DIAGNOSIS — F900 Attention-deficit hyperactivity disorder, predominantly inattentive type: Secondary | ICD-10-CM

## 2019-12-04 DIAGNOSIS — F3130 Bipolar disorder, current episode depressed, mild or moderate severity, unspecified: Secondary | ICD-10-CM | POA: Diagnosis not present

## 2019-12-04 DIAGNOSIS — F4310 Post-traumatic stress disorder, unspecified: Secondary | ICD-10-CM

## 2019-12-04 MED ORDER — CLONAZEPAM 0.5 MG PO TABS: 0.2500 mg | ORAL_TABLET | Freq: Three times a day (TID) | ORAL | 1 refills | Status: DC | PRN

## 2019-12-04 MED ORDER — ATOMOXETINE HCL 40 MG PO CAPS: 40.0000 mg | ORAL_CAPSULE | Freq: Every day | ORAL | 2 refills | Status: DC

## 2019-12-04 MED ORDER — BUPROPION HCL ER (XL) 150 MG PO TB24: 150.0000 mg | ORAL_TABLET | Freq: Every day | ORAL | 2 refills | Status: DC

## 2019-12-04 MED ORDER — QUETIAPINE FUMARATE 100 MG PO TABS: 100.0000 mg | ORAL_TABLET | Freq: Every day | ORAL | 2 refills | Status: DC

## 2019-12-04 MED ORDER — GABAPENTIN 300 MG PO CAPS: 300.0000 mg | ORAL_CAPSULE | Freq: Three times a day (TID) | ORAL | 2 refills | Status: DC

## 2019-12-04 NOTE — Progress Notes (Signed)
BH MD/PA/NP OP Progress Note      12/04/2019 10:53 AM Melissa Davies  MRN:  [id number]  Chief Complaint:  "The meds are so good. I feel better"  HPI: 49 year old female seen today for follow up psychiatric evaluation. She has a psychiatric history of ADD, depression, PTSD, Bipolar 1, and anxiety. She is currently being managed on Wellbutrin 150 daily, Klonopin 0.25 mg 3 times daily,  gabapentin 300 mg 3 times daily,starttera 40 mg, and Seroquel 100 mg.  She notes that her medications are  effective in managing her psychiatric conditions.  Today patient denies depression and reports that her anxiety has improved. She quantifies her anxiety as 3/10 (10 being severe anxiety).  Patient also reports that her mood has improved.  She notes longer becomes aggressive or snaps on individuals.  She also reports that her mood no longer fluctuates.  At times however she notes that she becomes irritable with her husband and his current medical condition.  She notes to avoid confrontation she avoids being his presence.  She notes that she tries not to concern herself with his health conditions as he does not follow medical advice.  She notes that his medical comorbidities has caused increased stress and a strain in their marriage.  She notes at times she fears that life may not time back to normal however she reports that she is hopeful that things will get better soon.  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  She notes that she loves her new position.  No medication changes made today.  Patient is agreeable to continue all medications as prescribed and follow-up with outpatient counselor for therapy.  No other concerns noted at this time.    Visit Diagnosis:    ICD-10-CM   1. Bipolar I disorder, most recent episode depressed (HCC)  F31.30 QUEtiapine (SEROQUEL) 100 MG tablet    gabapentin (NEURONTIN) 300 MG capsule    buPROPion (WELLBUTRIN XL) 150  MG 24 hr tablet  2. PTSD (post-traumatic stress disorder)  F43.10 clonazePAM (KLONOPIN) 0.5 MG tablet  3. Generalized anxiety disorder  F41.1 clonazePAM (KLONOPIN) 0.5 MG tablet  4. Attention deficit hyperactivity disorder (ADHD), predominantly inattentive type  F90.0 atomoxetine (STRATTERA) 40 MG capsule    Past Psychiatric History: Bipolar disorder, PTSD, MDD, GAD  Past Medical History:  Past Medical History:  Diagnosis Date  . Anxiety     Past Surgical History:  Procedure Laterality Date  . TUBAL LIGATION      Family Psychiatric History: unknown  Family History:  Family History  Problem Relation Age of Onset  . ADD / ADHD Son     Social History:  Social History   Socioeconomic History  . Marital status: Married    Spouse name: Not on file  . Number of children: Not on file  . Years of education: Not on file  . Highest education level: Not on file  Occupational History  . Occupation: alterations  Tobacco Use  . Smoking status: Current Some Day Smoker    Years: 1.00    Types: Cigarettes  . Smokeless tobacco: Never Used  Substance and Sexual Activity  . Alcohol use: Not Currently    Comment: occasional   . Drug use: Yes    Types: Marijuana  . Sexual activity: Yes    Partners: Male    Birth control/protection: None  Other Topics Concern  . 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 Difficulty of Paying Living Expenses: Not on file  Food Insecurity:   . Worried About Programme researcher, broadcasting/film/video in the Last Year: Not on file  . Ran Out of Food in the Last Year: Not on file  Transportation Needs:   . Lack of Transportation (Medical): Not on file  . Lack of Transportation (Non-Medical): Not on file  Physical Activity:   . Days of Exercise per Week: Not on file  . Minutes of Exercise per Session: Not on file  Stress:   . Feeling  of Stress : Not on file  Social Connections:   . Frequency of Communication with Friends and Family: Not on file  . Frequency of Social Gatherings with Friends and Family: Not on file  . Attends Religious Services: Not on file  . Active Member of Clubs or Organizations: Not on file  . Attends Banker Meetings: Not on file  . Marital Status: Not on file    Allergies:  Allergies  Allergen Reactions  . Klonopin [Clonazepam] Rash    Pt is currently on med    Metabolic Disorder Labs: No results found for: HGBA1C, MPG No results found for: PROLACTIN No results found for: CHOL, TRIG, HDL, CHOLHDL, VLDL, LDLCALC No results found for: TSH  Therapeutic Level Labs: No results found for: LITHIUM No results found for: VALPROATE No components found for:  CBMZ  Current Medications: Current Outpatient Medications  Medication Sig Dispense Refill  . acetaminophen (TYLENOL) 325 MG tablet Take 650 mg by mouth every 6 (six) hours as needed for mild pain or headache.    Marland Kitchen atomoxetine (STRATTERA) 40 MG capsule Take 1 capsule (40 mg total) by mouth daily. 30 capsule 2  . buPROPion (WELLBUTRIN XL) 150 MG 24 hr tablet Take 1 tablet (150 mg total) by mouth daily. 30 tablet 2  . clonazePAM (KLONOPIN) 0.5 MG tablet Take 0.5 tablets (0.25 mg total) by mouth 3 (three) times daily as needed for anxiety. 45 tablet 1  . gabapentin (NEURONTIN) 300 MG capsule Take 1 capsule (300 mg total) by mouth 3 (three) times daily. 90 capsule 2  . lidocaine (LIDODERM) 5 % Place 1 patch onto the skin daily. Remove & Discard patch within 12 hours or as directed by MD 14 patch 0  . loratadine (CLARITIN) 10 MG tablet Take 10 mg by mouth daily as needed for allergies.    . methocarbamol (ROBAXIN) 500 MG tablet Take 1 tablet (500 mg total) by mouth 2 (two) times daily. 20 tablet 0  . QUEtiapine (SEROQUEL) 100 MG tablet Take 1 tablet (100 mg total) by mouth daily. 30 tablet 2   No current facility-administered  medications for this visit.     Musculoskeletal: Strength & Muscle Tone: within normal limits Gait & Station: normal Patient leans: N/A  Psychiatric Specialty Exam: Review of Systems  Psychiatric/Behavioral: Positive for behavioral problems, decreased concentration, dysphoric mood and sleep disturbance. The patient is nervous/anxious and is hyperactive.   All other systems reviewed and are negative.   There were no vitals taken for this visit.There is no height or weight on file to calculate BMI.  General Appearance: Casual and Well Groomed  Eye Contact:  Good  Speech:  Clear and Coherent and Normal Rate  Volume:  Normal  Mood:  Euthymic  Affect:  Congruent  Thought Process:  Coherent, Goal Directed and Linear  Orientation:  Full (Time, Place, and Person)  Thought Content: WDL and Logical   Suicidal Thoughts:  No  Homicidal Thoughts:  No  Memory:  Immediate;   Good Recent;   Good Remote;   Good  Judgement:  Fair  Insight:  Fair  Psychomotor Activity:  Normal  Concentration:  Concentration: Good and Attention Span: Good  Recall:  Good  Fund of Knowledge: Good  Language: Good  Akathisia:  NA  Handed:  Right  AIMS (if indicated): not done  Assets:  Communication Skills Desire for Improvement Housing Social Support  ADL's:  Intact  Cognition: WNL  Sleep:  Poor   Screenings: AIMS     [facility]  AIMS Total Score 0    AUDIT     Ad[facility]  Alcohol Use Disorder Identification Test Final Score (AUDIT) 0    PHQ2-9     Office Visit from 08/25/2016 in Primary Care at [facility] Visit from 06/02/2016 in Primary Care at [facility] Visit from 09/28/2015 in Primary Care at [facility] Visit from 07/12/2013 in Primary Care at [facility] Total Score 0 3 6 0  PHQ-9 Total Score -- 21 23 --       Assessment and Plan:  Patient notes that her anxiety, mood, and depression has improved. No medication changes made today.  Patient is agreeable to continue all medications as prescribed.  1. Bipolar I disorder, most recent episode depressed (HCC)  Continue- QUEtiapine (SEROQUEL) 100 MG tablet; Take 1 tablet (100 mg total) by mouth daily.  Dispense: 30 tablet; Refill: 2 Continue- gabapentin (NEURONTIN) 300 MG capsule; Take 1 capsule (300 mg total) by mouth 3 (three) times daily.  Dispense: 90 capsule; Refill: 2 Continue- buPROPion (WELLBUTRIN XL) 150 MG 24 hr tablet; Take 1 tablet (150 mg total) by mouth daily.  Dispense: 30 tablet; Refill: 2  2. PTSD (post-traumatic stress disorder)  Continue- clonazePAM (KLONOPIN) 0.5 MG tablet; Take 0.5 tablets (0.25 mg total) by mouth 3 (three) times daily as needed for anxiety.  Dispense: 45 tablet; Refill: 1  3. Generalized anxiety disorder  Continue- clonazePAM (KLONOPIN) 0.5 MG tablet; Take 0.5 tablets (0.25 mg total) by mouth 3 (three) times daily as needed for anxiety.  Dispense: 45 tablet; Refill: 1  4. Attention deficit hyperactivity disorder (ADHD), predominantly inattentive type  Continue- atomoxetine (STRATTERA) 40 MG capsule; Take 1 capsule (40 mg total) by mouth daily.  Dispense: 30 capsule; Refill: 2   Follow up in two months Follow up with threapy    Shanna Cisco, NP 12/04/2019, 10:53 AM

## 2019-12-13 ENCOUNTER — Other Ambulatory Visit: Payer: Self-pay

## 2019-12-13 ENCOUNTER — Ambulatory Visit (INDEPENDENT_AMBULATORY_CARE_PROVIDER_SITE_OTHER): Payer: No Payment, Other | Admitting: Licensed Clinical Social Worker

## 2019-12-13 DIAGNOSIS — F3160 Bipolar disorder, current episode mixed, unspecified: Secondary | ICD-10-CM | POA: Diagnosis not present
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
## 2019-12-18 ENCOUNTER — Other Ambulatory Visit: Payer: Self-pay

## 2019-12-18 ENCOUNTER — Ambulatory Visit
Admission: EM | Admit: 2019-12-18 | Discharge: 2019-12-18 | Disposition: A | Discharge status: Home / Self Care | Payer: Self-pay | Attending: Internal Medicine | Admitting: Internal Medicine

## 2019-12-18 DIAGNOSIS — A64 Unspecified sexually transmitted disease: Secondary | ICD-10-CM | POA: Insufficient documentation

## 2019-12-18 LAB — WET PREP, GENITAL
Clue Cells Wet Prep HPF POC: NONE SEEN
Sperm: NONE SEEN
Trich, Wet Prep: NONE SEEN
Yeast Wet Prep HPF POC: NONE SEEN

## 2019-12-18 LAB — CHLAMYDIA/NGC RT PCR (ARMC ONLY)
Chlamydia Tr: NOT DETECTED
N gonorrhoeae: DETECTED — AB

## 2019-12-18 MED ORDER — DOXYCYCLINE HYCLATE 100 MG PO CAPS: 100.0000 mg | ORAL_CAPSULE | Freq: Two times a day (BID) | ORAL | 0 refills | Status: AC

## 2019-12-18 MED ORDER — CEFTRIAXONE SODIUM 500 MG IJ SOLR
500.0000 mg | Freq: Once | INTRAMUSCULAR | Status: AC
  Administered 2019-12-18: 500 mg via INTRAMUSCULAR

## 2019-12-18 MED ORDER — DOXYCYCLINE HYCLATE 100 MG PO TABS: 100.0000 mg | ORAL_TABLET | Freq: Two times a day (BID) | ORAL | Status: DC

## 2019-12-18 NOTE — ED Triage Notes (Signed)
Patient states that she has not had sexual intercourse in 5 years. States that started having intercourse again 10 days. Reports that she is having vaginal swelling, pain and yellow discharge.

## 2019-12-18 NOTE — Discharge Instructions (Addendum)
No sexual intercourse for 7 days to allow medication to work.

## 2019-12-18 NOTE — ED Provider Notes (Signed)
[facility]    CSN: [id number] Arrival date & time: 12/18/19  1607      History   Chief Complaint Chief Complaint  Patient presents with  . Vaginal Discharge    HPI Melissa Davies is a 49 y.o. female comes to the urgent care with complaints of vaginal discharge of a few days duration.  Patient recently became sexually active with a new sexual partner.  She has engaged in unprotected sexual intercourse with a partner.  Discharge is yellowish.  Discharges both vaginal and urethral.  No ulcerations or ulcers in the genital area.  No abdominal pain.  She admits to deep dyspareunia.  No fever or chills.  Mild dysuria without urgency or frequency. HPI  Past Medical History:  Diagnosis Date  . Anxiety     Patient Active Problem List   Diagnosis Date Noted  . Bipolar I disorder, most recent episode depressed (HCC) 10/25/2019  . MDD (major depressive disorder), recurrent episode, severe (HCC) 07/22/2019  . Major depressive disorder, recurrent episode (HCC) 07/22/2019  . Suicidal ideation 07/21/2019  . PTSD (post-traumatic stress disorder) 09/13/2013  . Depression, reactive 05/24/2013  . ADD (attention deficit disorder) 09/26/2012  . Generalized anxiety disorder 09/26/2012  . BMI 36.0-36.9,adult 09/26/2012    Past Surgical History:  Procedure Laterality Date  . TUBAL LIGATION      OB History   No obstetric history on file.      Home Medications    Prior to Admission medications   Medication Sig Start Date End Date Taking? Authorizing Provider  atomoxetine (STRATTERA) 40 MG capsule Take 1 capsule (40 mg total) by mouth daily. 12/04/19  Yes Toy Cookey E, NP  buPROPion (WELLBUTRIN XL) 150 MG 24 hr tablet Take 1 tablet (150 mg total) by mouth daily. 12/04/19  Yes Toy Cookey E, NP  clonazePAM (KLONOPIN) 0.5 MG tablet Take 0.5 tablets (0.25 mg total) by mouth 3 (three) times daily as needed for anxiety. 12/04/19  Yes Toy Cookey E, NP    gabapentin (NEURONTIN) 300 MG capsule Take 1 capsule (300 mg total) by mouth 3 (three) times daily. 12/04/19  Yes Toy Cookey E, NP  lidocaine (LIDODERM) 5 % Place 1 patch onto the skin daily. Remove & Discard patch within 12 hours or as directed by MD 10/31/19  Yes McDonald, Mia A, PA-C  loratadine (CLARITIN) 10 MG tablet Take 10 mg by mouth daily as needed for allergies.   Yes [provider]  methocarbamol (ROBAXIN) 500 MG tablet Take 1 tablet (500 mg total) by mouth 2 (two) times daily. 10/31/19  Yes McDonald, Mia A, PA-C  QUEtiapine (SEROQUEL) 100 MG tablet Take 1 tablet (100 mg total) by mouth daily. 12/04/19  Yes Toy Cookey E, NP  doxycycline (VIBRAMYCIN) 100 MG capsule Take 1 capsule (100 mg total) by mouth 2 (two) times daily for 7 days. 12/18/19 12/25/19  Merrilee Jansky, MD  FLUoxetine (PROZAC) 20 MG capsule Take 1 capsule (20 mg total) by mouth daily. Patient not taking: Reported on 10/31/2019 07/25/19 10/31/19  Aldean Baker, NP    Family History Family History  Problem Relation Age of Onset  . ADD / ADHD Son     Social History Social History   Tobacco Use  . Smoking status: Current Some Day Smoker    Years: 1.00    Types: Cigarettes  . Smokeless tobacco: Never Used  Vaping Use  . Vaping Use: Never used  Substance Use Topics  . Alcohol use: Not  Currently    Comment: occasional   . Drug use: Yes    Types: Marijuana     Allergies   Klonopin [clonazepam]   Review of Systems Review of Systems  Constitutional: Negative.   HENT: Negative.   Respiratory: Negative.   Genitourinary: Positive for dyspareunia, dysuria, genital sores, vaginal discharge and vaginal pain. Negative for flank pain, hematuria, urgency and vaginal bleeding.  Musculoskeletal: Negative.   Neurological: Negative.      Physical Exam Triage Vital Signs ED Triage Vitals  Enc Vitals Group     BP 12/18/19 1631 120/72     Pulse Rate 12/18/19 1631 81     Resp 12/18/19 1631 18      Temp 12/18/19 1631 98.4 F (36.9 C)     Temp Source 12/18/19 1631 Oral     SpO2 12/18/19 1631 99 %     Weight 12/18/19 1625 180 lb (81.6 kg)     Height 12/18/19 1625 5' (1.524 m)     Head Circumference --      Peak Flow --      Pain Score 12/18/19 1625 8     Pain Loc --      Pain Edu? --      Excl. in GC? --    No data found.  Updated Vital Signs BP 120/72 (BP Location: Left Arm)   Pulse 81   Temp 98.4 F (36.9 C) (Oral)   Resp 18   Ht 5' (1.524 m)   Wt 81.6 kg   LMP 05/11/2016 (Approximate)   SpO2 99%   BMI 35.15 kg/m   Visual Acuity Right Eye Distance:   Left Eye Distance:   Bilateral Distance:    Right Eye Near:   Left Eye Near:    Bilateral Near:     Physical Exam Vitals reviewed.  Constitutional:      General: She is not in acute distress.    Appearance: She is not ill-appearing.  Cardiovascular:     Rate and Rhythm: Normal rate and regular rhythm.  Pulmonary:     Effort: Pulmonary effort is normal.     Breath sounds: Normal breath sounds.  Skin:    General: Skin is warm.     Capillary Refill: Capillary refill takes less than 2 seconds.  Neurological:     General: No focal deficit present.     Mental Status: She is alert and oriented to person, place, and time.      UC Treatments / Results  Labs (all labs ordered are listed, but only abnormal results are displayed) Labs Reviewed  WET PREP, GENITAL - Abnormal; Notable for the following components:      Result Value   WBC, Wet Prep HPF POC MODERATE (*)    All other components within normal limits  CHLAMYDIA/NGC RT PCR ([facility] ONLY)  RPR  HIV ANTIBODY (ROUTINE TESTING W REFLEX)    EKG   Radiology No results found.  Procedures Procedures (including critical care time)  Medications Ordered in UC Medications  cefTRIAXone (ROCEPHIN) injection 500 mg (500 mg Intramuscular Given 12/18/19 1708)    Initial Impression / Assessment and Plan / UC Course  I have reviewed the triage  vital signs and the nursing notes.  Pertinent labs & imaging results that were available during my care of the patient were reviewed by me and considered in my medical decision making (see chart for details).     1.  Sexually transmitted disease: This is likely gonorrhea Ceftriaxone 500 mg  IM x1 dose Doxycycline 100 mg twice daily x7 days Abstain from sexual intercourse for 7 days Wet prep is negative for trichomonas GC/chlamydia PCR is pending HIV, RPR. Return precautions given.  Final Clinical Impressions(s) / UC Diagnoses   Final diagnoses:  STD (female)     Discharge Instructions     No sexual intercourse for 7 days to allow medication to work.   ED Prescriptions    Medication Sig Dispense Auth. Provider   doxycycline (VIBRAMYCIN) 100 MG capsule Take 1 capsule (100 mg total) by mouth 2 (two) times daily for 7 days. 14 capsule Moriah Shawley, Britta Mccreedy, MD     PDMP not reviewed this encounter.   Merrilee Jansky, MD 12/18/19 [phone]

## 2019-12-19 LAB — RPR: RPR Ser Ql: NONREACTIVE

## 2019-12-19 LAB — HIV ANTIBODY (ROUTINE TESTING W REFLEX): HIV Screen 4th Generation wRfx: NONREACTIVE

## 2019-12-27 ENCOUNTER — Ambulatory Visit (HOSPITAL_COMMUNITY): Payer: Self-pay | Admitting: Licensed Clinical Social Worker

## 2020-01-02 ENCOUNTER — Emergency Department (HOSPITAL_COMMUNITY)
Admission: EM | Admit: 2020-01-02 | Discharge: 2020-01-02 | Disposition: A | Discharge status: Home / Self Care | Payer: Self-pay | Attending: Emergency Medicine | Admitting: Emergency Medicine

## 2020-01-02 ENCOUNTER — Encounter (HOSPITAL_COMMUNITY): Payer: Self-pay | Admitting: Emergency Medicine

## 2020-01-02 ENCOUNTER — Other Ambulatory Visit: Payer: Self-pay

## 2020-01-02 ENCOUNTER — Emergency Department (HOSPITAL_COMMUNITY): Payer: Self-pay

## 2020-01-02 DIAGNOSIS — Z711 Person with feared health complaint in whom no diagnosis is made: Secondary | ICD-10-CM

## 2020-01-02 DIAGNOSIS — F17210 Nicotine dependence, cigarettes, uncomplicated: Secondary | ICD-10-CM | POA: Insufficient documentation

## 2020-01-02 DIAGNOSIS — N3001 Acute cystitis with hematuria: Secondary | ICD-10-CM | POA: Insufficient documentation

## 2020-01-02 DIAGNOSIS — Z202 Contact with and (suspected) exposure to infections with a predominantly sexual mode of transmission: Secondary | ICD-10-CM | POA: Insufficient documentation

## 2020-01-02 LAB — CBC WITH DIFFERENTIAL/PLATELET
Abs Immature Granulocytes: 0.04 10*3/uL (ref 0.00–0.07)
Basophils Absolute: 0 10*3/uL (ref 0.0–0.1)
Basophils Relative: 0 %
Eosinophils Absolute: 0.1 10*3/uL (ref 0.0–0.5)
Eosinophils Relative: 1 %
HCT: 41.7 % (ref 36.0–46.0)
Hemoglobin: 13.5 g/dL (ref 12.0–15.0)
Immature Granulocytes: 0 %
Lymphocytes Relative: 11 %
Lymphs Abs: 1.5 10*3/uL (ref 0.7–4.0)
MCH: 30.7 pg (ref 26.0–34.0)
MCHC: 32.4 g/dL (ref 30.0–36.0)
MCV: 94.8 fL (ref 80.0–100.0)
Monocytes Absolute: 0.9 10*3/uL (ref 0.1–1.0)
Monocytes Relative: 7 %
Neutro Abs: 10.4 10*3/uL — ABNORMAL HIGH (ref 1.7–7.7)
Neutrophils Relative %: 81 %
Platelets: 294 10*3/uL (ref 150–400)
RBC: 4.4 MIL/uL (ref 3.87–5.11)
RDW: 13.2 % (ref 11.5–15.5)
WBC: 12.9 10*3/uL — ABNORMAL HIGH (ref 4.0–10.5)
nRBC: 0 % (ref 0.0–0.2)

## 2020-01-02 LAB — WET PREP, GENITAL
Clue Cells Wet Prep HPF POC: NONE SEEN
Sperm: NONE SEEN
Trich, Wet Prep: NONE SEEN
Yeast Wet Prep HPF POC: NONE SEEN

## 2020-01-02 LAB — BASIC METABOLIC PANEL
Anion gap: 9 (ref 5–15)
BUN: 7 mg/dL (ref 6–20)
CO2: 29 mmol/L (ref 22–32)
Calcium: 9.6 mg/dL (ref 8.9–10.3)
Chloride: 104 mmol/L (ref 98–111)
Creatinine, Ser: 0.76 mg/dL (ref 0.44–1.00)
GFR calc Af Amer: >60 mL/min (ref >60)
GFR calc non Af Amer: >60 mL/min (ref >60)
Glucose, Bld: 105 mg/dL — ABNORMAL HIGH (ref 70–99)
Potassium: 4.1 mmol/L (ref 3.5–5.1)
Sodium: 142 mmol/L (ref 135–145)

## 2020-01-02 LAB — URINALYSIS, ROUTINE W REFLEX MICROSCOPIC
Bilirubin Urine: NEGATIVE
Glucose, UA: NEGATIVE mg/dL
Ketones, ur: NEGATIVE mg/dL
Nitrite: NEGATIVE
Protein, ur: NEGATIVE mg/dL
RBC / HPF: >50 RBC/hpf — ABNORMAL HIGH (ref 0–5)
Specific Gravity, Urine: 1.004 — ABNORMAL LOW (ref 1.005–1.030)
WBC, UA: >50 WBC/hpf — ABNORMAL HIGH (ref 0–5)
pH: 6 (ref 5.0–8.0)

## 2020-01-02 MED ORDER — DOXYCYCLINE HYCLATE 100 MG PO CAPS: 100.0000 mg | ORAL_CAPSULE | Freq: Two times a day (BID) | ORAL | 0 refills | Status: AC

## 2020-01-02 MED ORDER — FOSFOMYCIN TROMETHAMINE 3 G PO PACK
3.0000 g | PACK | Freq: Once | ORAL | Status: AC
  Administered 2020-01-02: 3 g via ORAL
  Filled 2020-01-02: qty 3

## 2020-01-02 MED ORDER — CEFTRIAXONE SODIUM 1 G IJ SOLR
1.0000 g | Freq: Once | INTRAMUSCULAR | Status: AC
  Administered 2020-01-02: 1 g via INTRAMUSCULAR
  Filled 2020-01-02: qty 10

## 2020-01-02 MED ORDER — DOXYCYCLINE HYCLATE 100 MG PO TABS
100.0000 mg | ORAL_TABLET | Freq: Once | ORAL | Status: AC
  Administered 2020-01-02: 100 mg via ORAL
  Filled 2020-01-02: qty 1

## 2020-01-02 MED ORDER — STERILE WATER FOR INJECTION IJ SOLN
INTRAMUSCULAR | Status: AC
  Administered 2020-01-02: 2.1 mL
  Filled 2020-01-02: qty 10

## 2020-01-02 NOTE — ED Notes (Signed)
Urine culture sent to lab.

## 2020-01-02 NOTE — ED Provider Notes (Signed)
[facility]EMERGENC[facility]PT Provider Note   CSN: [id number] Arrival date & time: 01/02/20  1424     History Chief Complaint  Patient presents with  . Hematuria    Melissa Davies is a 49 y.o. female with past medical history significant for gonorrhea who presents for evaluation of hematuria, vaginal discharge and burning with urination.  Patient states originally seen by urgent care 2 to 3 weeks ago diagnosed with gonorrhea.  Completed Rocephin and doxycycline.  Patient has not been sexually active since treatment.  Has had some yellow discharge.  States she has noticed since yesterday evening but she has had some blood in her urine.  Has had some dysuria and urinary frequency.  No flank pain or abdominal pain.  Is postmenopausal.  Denies fever, chills, nausea,, chest pain, shortness of breath abdominal pain, pelvic pain, rashes or lesions.  Denies aggravating or alleviating factors.  History obtained from patient and past medical records.  No interpreter used.  HPI     Past Medical History:  Diagnosis Date  . Anxiety     Patient Active Problem List   Diagnosis Date Noted  . Bipolar I disorder, most recent episode depressed (HCC) 10/25/2019  . MDD (major depressive disorder), recurrent episode, severe (HCC) 07/22/2019  . Major depressive disorder, recurrent episode (HCC) 07/22/2019  . Suicidal ideation 07/21/2019  . PTSD (post-traumatic stress disorder) 09/13/2013  . Depression, reactive 05/24/2013  . ADD (attention deficit disorder) 09/26/2012  . Generalized anxiety disorder 09/26/2012  . BMI 36.0-36.9,adult 09/26/2012    Past Surgical History:  Procedure Laterality Date  . TUBAL LIGATION       OB History   No obstetric history on file.     Family History  Problem Relation Age of Onset  . ADD / ADHD Son     Social History   Tobacco Use  . Smoking status: Current Some Day Smoker    Years: 1.00    Types: Cigarettes  . Smokeless  tobacco: Never Used  Vaping Use  . Vaping Use: Never used  Substance Use Topics  . Alcohol use: Not Currently    Comment: occasional   . Drug use: Yes    Types: Marijuana    Home Medications Prior to Admission medications   Medication Sig Start Date End Date Taking? Authorizing Provider  atomoxetine (STRATTERA) 40 MG capsule Take 1 capsule (40 mg total) by mouth daily. 12/04/19   Shanna CiscoParsons, Brittney E, NP  buPROPion (WELLBUTRIN XL) 150 MG 24 hr tablet Take 1 tablet (150 mg total) by mouth daily. 12/04/19   Shanna CiscoParsons, Brittney E, NP  clonazePAM (KLONOPIN) 0.5 MG tablet Take 0.5 tablets (0.25 mg total) by mouth 3 (three) times daily as needed for anxiety. 12/04/19   Shanna CiscoParsons, Brittney E, NP  doxycycline (VIBRAMYCIN) 100 MG capsule Take 1 capsule (100 mg total) by mouth 2 (two) times daily for 14 days. 01/02/20 01/16/20  Ching Rabideau A, PA-C  gabapentin (NEURONTIN) 300 MG capsule Take 1 capsule (300 mg total) by mouth 3 (three) times daily. 12/04/19   Toy CookeyParsons, Brittney E, NP  lidocaine (LIDODERM) 5 % Place 1 patch onto the skin daily. Remove & Discard patch within 12 hours or as directed by MD 10/31/19   McDonald, Mia A, PA-C  loratadine (CLARITIN) 10 MG tablet Take 10 mg by mouth daily as needed for allergies.    [provider]  methocarbamol (ROBAXIN) 500 MG tablet Take 1 tablet (500 mg total) by mouth 2 (two) times daily.  10/31/19   McDonald, Mia A, PA-C  QUEtiapine (SEROQUEL) 100 MG tablet Take 1 tablet (100 mg total) by mouth daily. 12/04/19   Shanna Cisco, NP  FLUoxetine (PROZAC) 20 MG capsule Take 1 capsule (20 mg total) by mouth daily. Patient not taking: Reported on 10/31/2019 07/25/19 10/31/19  Aldean Baker, NP    Allergies    Klonopin [clonazepam]  Review of Systems   Review of Systems  Constitutional: Negative.   HENT: Negative.   Respiratory: Negative.   Cardiovascular: Negative.   Gastrointestinal: Negative.   Genitourinary: Positive for dysuria, frequency,  hematuria, urgency and vaginal discharge. Negative for decreased urine volume, difficulty urinating, dyspareunia, enuresis, flank pain, menstrual problem and vaginal bleeding.  Musculoskeletal: Negative.   Skin: Negative.   Neurological: Negative.   All other systems reviewed and are negative.   Physical Exam Updated Vital Signs BP 112/71 (BP Location: Left Arm)   Pulse (!) 58   Temp 97.8 F (36.6 C) (Oral)   Resp 18   Ht 5' (1.524 m)   Wt 81.6 kg   LMP 05/11/2016 (Approximate)   SpO2 98%   BMI 35.15 kg/m   Physical Exam Vitals and nursing note reviewed.  Constitutional:      General: She is not in acute distress.    Appearance: She is well-developed. She is not ill-appearing, toxic-appearing or diaphoretic.  HENT:     Head: Normocephalic and atraumatic.     Nose: Nose normal.     Mouth/Throat:     Mouth: Mucous membranes are moist.  Eyes:     Pupils: Pupils are equal, round, and reactive to light.  Cardiovascular:     Rate and Rhythm: Normal rate.     Pulses: Normal pulses.     Heart sounds: Normal heart sounds.  Pulmonary:     Effort: Pulmonary effort is normal. No respiratory distress.     Breath sounds: Normal breath sounds.  Abdominal:     General: Bowel sounds are normal. There is no distension.     Palpations: There is no mass.     Tenderness: There is no abdominal tenderness. There is no right CVA tenderness, left CVA tenderness, guarding or rebound.     Hernia: No hernia is present.     Comments: Soft, nontender without rebound or guarding.  Negative CVA tap bilaterally.  Genitourinary:    Comments: Normal appearing external female genitalia without rashes or lesions, normal vaginal epithelium. Normal appearing cervix. Yellow frothy yellow discharge at cervical os. No cervical  petechiae. Cervical os is closed. There is no bleeding noted at the os. No dor. Bimanual: No CMT, nontender.  No palpable adnexal masses or tenderness. Uterus midline and not fixed.  Rectovaginal exam was deferred.  No cystocele or rectocele noted. No pelvic lymphadenopathy noted. Wet prep was obtained.  Cultures for gonorrhea and chlamydia collected. Exam performed with chaperone in room. Musculoskeletal:        General: Normal range of motion.     Cervical back: Normal range of motion.     Comments: Moves all 4 extremities without difficulty.  Compartment soft  Skin:    General: Skin is warm and dry.  Neurological:     Mental Status: She is alert.     ED Results / Procedures / Treatments   Labs (all labs ordered are listed, but only abnormal results are displayed) Labs Reviewed  WET PREP, GENITAL - Abnormal; Notable for the following components:      Result Value  WBC, Wet Prep HPF POC PRESENT (*)    All other components within normal limits  URINALYSIS, ROUTINE W REFLEX MICROSCOPIC - Abnormal; Notable for the following components:   Specific Gravity, Urine 1.004 (*)    Hgb urine dipstick LARGE (*)    Leukocytes,Ua LARGE (*)    RBC / HPF >50 (*)    WBC, UA >50 (*)    Bacteria, UA RARE (*)    All other components within normal limits  CBC WITH DIFFERENTIAL/PLATELET - Abnormal; Notable for the following components:   WBC 12.9 (*)    Neutro Abs 10.4 (*)    All other components within normal limits  BASIC METABOLIC PANEL - Abnormal; Notable for the following components:   Glucose, Bld 105 (*)    All other components within normal limits  URINE CULTURE  RPR  HIV ANTIBODY (ROUTINE TESTING W REFLEX)  GC/CHLAMYDIA PROBE AMP ([place]) NOT AT [facility]    EKG None  Radiology CT Renal Stone Study  Result Date: 01/02/2020 CLINICAL DATA:  Hematuria. EXAM: CT ABDOMEN AND PELVIS WITHOUT CONTRAST TECHNIQUE: Multidetector CT imaging of the abdomen and pelvis was performed following the standard protocol without IV contrast. COMPARISON:  None. FINDINGS: Lower chest: No acute abnormality. Hepatobiliary: A 6 mm focus of parenchymal low attenuation is seen within the  anterolateral aspect of the right lobe of. No gallstones, gallbladder wall thickening, or biliary dilatation. Pancreas: Unremarkable. No pancreatic ductal dilatation or surrounding inflammatory changes. Spleen: Normal in size without focal abnormality. Adrenals/Urinary Tract: Adrenal glands are unremarkable. Kidneys are normal, without renal calculi, focal lesion, or hydronephrosis. Bladder is unremarkable. Stomach/Bowel: Stomach is within normal limits. Appendix appears normal. No evidence of bowel wall thickening, distention, or inflammatory changes. Vascular/Lymphatic: No significant vascular findings are present. No enlarged abdominal or pelvic lymph nodes. Reproductive: Uterus is normal in appearance. Tubal ligation clips are seen within the bilateral adnexa. Other: No abdominal wall hernia or abnormality. No abdominopelvic ascites. Musculoskeletal: No acute or significant osseous findings. IMPRESSION: 6 mm hepatic cyst versus hemangioma. Correlation with hepatic ultrasound is recommended. Electronically Signed   By: Aram Candela M.D.   On: 01/02/2020 19:11    Procedures Procedures (including critical care time)  Medications Ordered in ED Medications  cefTRIAXone (ROCEPHIN) injection 1 g (has no administration in time range)  fosfomycin (MONUROL) packet 3 g (has no administration in time range)  doxycycline (VIBRA-TABS) tablet 100 mg (has no administration in time range)  sterile water (preservative free) injection (has no administration in time range)   ED Course  I have reviewed the triage vital signs and the nursing notes.  Pertinent labs & imaging results that were available during my care of the patient were reviewed by me and considered in my medical decision making (see chart for details).  48 presents for evaluation of hematuria, dysuria and vaginal discharge.  She is afebrile, nonseptic, non-ill-appearing.  Treated 3 weeks ago for gonorrhea.  Has not been sexually active since.   Tolerating p.o. intake at home.  Her heart and lungs are clear.  Her abdomen is soft, nontender.  Negative CVA tap bilaterally.  She is not anticoagulated.  GU exam with yellow discharge at cervical os.  No cervical motion tenderness or adnexal tenderness.  Low suspicion for PID, abscess.  Urine consistent with large leuks, bacteria and greater than 50 WBC.  She will be treated for possible UTI given symptoms and urine was sent for culture.  Wet prep does show many WBC.  Labs  and imaging personally interpreted which show leukocytosis at 12.9, metabolic panel with electrolyte, renal or liver abnormality CT stone study shows possible hepatic cyst versus hemangioma, recommend ultrasound.  We do not have this available at this time.  Do not feel she needs this emergently.  Discussed with patient she will follow-up outpatient with PCP.  Given this patient's clinical exam will retreat for gonorrhea given high clinical suspicion.  She will follow-up with OB/GYN at the end of course of treatment for treatment of care.  We will also treat for UTI.  Appears otherwise stable.  Tolerating p.o. intake without difficulty.  We will have her follow-up outpatient.  Patient does not meet the SIRS or Sepsis criteria.  On repeat exam patient does not have a surgical abdomin and there are no peritoneal signs.  No indication of appendicitis, bowel obstruction, bowel perforation, cholecystitis, diverticulitis, PID, torsion or ectopic pregnancy.    The patient has been appropriately medically screened and/or stabilized in the ED. I have low suspicion for any other emergent medical condition which would require further screening, evaluation or treatment in the ED or require inpatient management.  Patient is hemodynamically stable and in no acute distress.  Patient able to ambulate in department prior to ED.  Evaluation does not show acute pathology that would require ongoing or additional emergent interventions while in the emergency  department or further inpatient treatment.  I have discussed the diagnosis with the patient and answered all questions.  Pain is been managed while in the emergency department and patient has no further complaints prior to discharge.  Patient is comfortable with plan discussed in room and is stable for discharge at this time.  I have discussed strict return precautions for returning to the emergency department.  Patient was encouraged to follow-up with PCP/specialist refer to at discharge.     MDM Rules/Calculators/A&P                           Final Clinical Impression(s) / ED Diagnoses Final diagnoses:  Concern about STD in female without diagnosis  Acute cystitis with hematuria    Rx / DC Orders ED Discharge Orders         Ordered    doxycycline (VIBRAMYCIN) 100 MG capsule  2 times daily        01/02/20 2035           Ruhaan Nordahl A, PA-C 01/02/20 2044    Rolan Bucco, MD 01/02/20 2057

## 2020-01-02 NOTE — Discharge Instructions (Addendum)
Follow-up with OB/GYN to ensure treatment of cure for the gonorrhea.  Call to schedule an appointment  Did have a possible cyst versus hemangioma on your CT scan.  They recommend ultrasound follow-up outpatient.  Return for new or worsening symptoms.

## 2020-01-02 NOTE — ED Triage Notes (Signed)
Patient c/o hematuria worsening since yesterday. Reports frequency and dysuria.

## 2020-01-03 LAB — URINE CULTURE

## 2020-01-03 LAB — RPR: RPR Ser Ql: NONREACTIVE

## 2020-01-03 LAB — GC/CHLAMYDIA PROBE AMP (~~LOC~~) NOT AT ARMC
Chlamydia: NEGATIVE
Comment: NEGATIVE
Comment: NORMAL
Neisseria Gonorrhea: NEGATIVE

## 2020-01-03 LAB — HIV ANTIBODY (ROUTINE TESTING W REFLEX): HIV Screen 4th Generation wRfx: NONREACTIVE

## 2020-01-10 ENCOUNTER — Other Ambulatory Visit: Payer: Self-pay

## 2020-01-10 ENCOUNTER — Ambulatory Visit (HOSPITAL_COMMUNITY): Payer: No Payment, Other | Admitting: Licensed Clinical Social Worker

## 2020-01-10 ENCOUNTER — Telehealth (HOSPITAL_COMMUNITY): Payer: Self-pay | Admitting: Psychiatry

## 2020-01-10 DIAGNOSIS — F3160 Bipolar disorder, current episode mixed, unspecified: Secondary | ICD-10-CM
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
## 2020-01-15 ENCOUNTER — Ambulatory Visit: Payer: Self-pay | Admitting: Nurse Practitioner

## 2020-01-17 ENCOUNTER — Telehealth (HOSPITAL_COMMUNITY): Payer: No Payment, Other | Admitting: Psychiatry

## 2020-01-17 ENCOUNTER — Other Ambulatory Visit: Payer: Self-pay

## 2020-01-24 ENCOUNTER — Ambulatory Visit (HOSPITAL_COMMUNITY): Payer: Self-pay | Admitting: Licensed Clinical Social Worker

## 2020-01-25 ENCOUNTER — Encounter (HOSPITAL_COMMUNITY): Payer: No Payment, Other | Admitting: Psychiatry

## 2020-02-05 ENCOUNTER — Ambulatory Visit (HOSPITAL_COMMUNITY): Payer: No Payment, Other | Admitting: Licensed Clinical Social Worker

## 2020-03-22 ENCOUNTER — Encounter (HOSPITAL_COMMUNITY): Payer: Self-pay | Admitting: Psychiatry

## 2020-03-22 ENCOUNTER — Telehealth (INDEPENDENT_AMBULATORY_CARE_PROVIDER_SITE_OTHER): Payer: No Payment, Other | Admitting: Psychiatry

## 2020-03-22 DIAGNOSIS — F3130 Bipolar disorder, current episode depressed, mild or moderate severity, unspecified: Secondary | ICD-10-CM

## 2020-03-22 DIAGNOSIS — F411 Generalized anxiety disorder: Secondary | ICD-10-CM

## 2020-03-22 DIAGNOSIS — F4310 Post-traumatic stress disorder, unspecified: Secondary | ICD-10-CM

## 2020-03-22 DIAGNOSIS — F900 Attention-deficit hyperactivity disorder, predominantly inattentive type: Secondary | ICD-10-CM

## 2020-03-22 MED ORDER — GABAPENTIN 300 MG PO CAPS: 300.0000 mg | ORAL_CAPSULE | Freq: Three times a day (TID) | ORAL | 2 refills | Status: DC

## 2020-03-22 MED ORDER — CLONAZEPAM 0.5 MG PO TABS: 0.2500 mg | ORAL_TABLET | Freq: Three times a day (TID) | ORAL | 1 refills | Status: DC | PRN

## 2020-03-22 MED ORDER — ATOMOXETINE HCL 80 MG PO CAPS: 80.0000 mg | ORAL_CAPSULE | Freq: Every day | ORAL | 2 refills | Status: DC

## 2020-03-22 MED ORDER — BUPROPION HCL ER (XL) 300 MG PO TB24: 300.0000 mg | ORAL_TABLET | Freq: Every day | ORAL | 2 refills | Status: DC

## 2020-03-22 MED ORDER — QUETIAPINE FUMARATE 100 MG PO TABS: 100.0000 mg | ORAL_TABLET | Freq: Every day | ORAL | 2 refills | Status: DC

## 2020-03-22 NOTE — Progress Notes (Signed)
BH MD/PA/NP OP Progress Note Virtual Visit via Video Note  I connected with Melissa Davies on 03/22/20 at  8:30 AM EST by a video enabled telemedicine application and verified that I am speaking with the correct person using two identifiers.  Location: Patient: Home Provider: Clinic   I discussed the limitations of evaluation and management by telemedicine and the availability of in person appointments. The patient expressed understanding and agreed to proceed.  I provided 30 minutes of non-face-to-face time during this encounter.         03/22/2020 8:24 AM Melissa Davies  MRN:  [id number]  Chief Complaint:  "I think I need to go up on my meds "  HPI: 49 year old female seen today for follow up psychiatric evaluation. She has a psychiatric history of ADD, depression, PTSD, Bipolar 1, and anxiety. She is currently being managed on Wellbutrin 150 daily, Klonopin 0.25 mg 3 times daily,  gabapentin 300 mg 3 times daily,starttera 40 mg, and Seroquel 100 mg.  She informed provider that she has not been taking her Seroquel regularly and is now more anxious and depressed.   Today patient is tearful, cooperative, engaged in conversation, well groomed, and maintained eye contact. She informed provider that lately she has been stressed. She noted that she recently started a new job at [employer] and got written up after the first week because she called someone childish. She also noted that recently she was in the hospital and now her husband is in the hospital because a rod broke in his legg. She describes her mood as anxious and depressed. Provider conducted a GAD 7 and a PHQ 9 and patient scored a 21 on both. She notes that she constantly feels on edge, is restless, and believes something awful will happen. She also informed Clinical research associate that she has poor concentration and is unable to complete task.   Patient informed Clinical research associate that since she stopped taking Seroquel as prescribed she has been  more irritable, had fluctuations in her mood, poor sleep (noting she sleeps 2-3 hours nightly), has increased energy, and impulsive spending. She denied SI/HI/VAH or paranoia.   Patient agreeable to increase Wellbutrin Xl 150 mg to 300 mg to help manage symptoms of depression. She will restart Seroquel 100 mg to help manage mood and sleep. Patient offered trazodone to help with sleep however she noted that it was ineffective in the past. Patient also offered hydroxyzine or Buspar however she noted that Klonopin has been the most effective. She is also agreeable to increase Strattera 40 mg to 80 mg to help manage symptoms of ADHD. She will continue all medications as prescribed. Patient informed Clinical research associate that she missed her last few counseling sessions. Provider encouraged patient to follow-up with outpatient counselor for therapy.  No other concerns noted at this time.    Visit Diagnosis:    ICD-10-CM   1. Attention deficit hyperactivity disorder (ADHD), predominantly inattentive type  F90.0 atomoxetine (STRATTERA) 80 MG capsule  2. Bipolar I disorder, most recent episode depressed (HCC)  F31.30 buPROPion (WELLBUTRIN XL) 300 MG 24 hr tablet    gabapentin (NEURONTIN) 300 MG capsule    QUEtiapine (SEROQUEL) 100 MG tablet  3. PTSD (post-traumatic stress disorder)  F43.10 clonazePAM (KLONOPIN) 0.5 MG tablet  4. Generalized anxiety disorder  F41.1 clonazePAM (KLONOPIN) 0.5 MG tablet    Past Psychiatric History: Bipolar disorder, PTSD, MDD, GAD  Past Medical History:  Past Medical History:  Diagnosis Date  . Anxiety  Past Surgical History:  Procedure Laterality Date  . TUBAL LIGATION      Family Psychiatric History: unknown  Family History:  Family History  Problem Relation Age of Onset  . ADD / ADHD Son     Social History:  Social History   Socioeconomic History  . Marital status: Married    Spouse name: Not on file  . Number of children: Not on file  . Years of education: Not on  file  . Highest education level: Not on file  Occupational History  . Occupation: alterations  Tobacco Use  . Smoking status: Current Some Day Smoker    Years: 1.00    Types: Cigarettes  . Smokeless tobacco: Never Used  Vaping Use  . Vaping Use: Never used  Substance and Sexual Activity  . Alcohol use: Not Currently    Comment: occasional   . Drug use: Yes    Types: Marijuana  . Sexual activity: Yes    Partners: Male    Birth control/protection: None  Other Topics Concern  . 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 Klonopin [Clonazepam] Rash    Pt is currently on med    Metabolic Disorder Labs: No results found for: HGBA1C, MPG No results found for: PROLACTIN No results found for: CHOL, TRIG, HDL, CHOLHDL, VLDL, LDLCALC No results found for: TSH  Therapeutic Level Labs: No results found for: LITHIUM No results found for: VALPROATE No components found for:  CBMZ  Current Medications: Current Outpatient Medications  Medication Sig Dispense Refill  . atomoxetine (STRATTERA) 80 MG capsule Take 1 capsule (80 mg total) by mouth daily. 30 capsule 2  . buPROPion (WELLBUTRIN XL) 300 MG 24 hr tablet Take 1 tablet (300 mg total) by mouth daily. 30 tablet 2  . clonazePAM (KLONOPIN) 0.5 MG tablet Take 0.5 tablets (0.25 mg total) by mouth 3 (three) times daily as needed for anxiety. 45 tablet 1  . gabapentin (NEURONTIN) 300 MG capsule Take 1 capsule (300 mg total) by mouth 3 (three) times daily. 90 capsule 2  . lidocaine (LIDODERM) 5 % Place 1 patch onto the skin daily. Remove & Discard patch within 12 hours or as directed by MD 14  patch 0  . loratadine (CLARITIN) 10 MG tablet Take 10 mg by mouth daily as needed for allergies.    . methocarbamol (ROBAXIN) 500 MG tablet Take 1 tablet (500 mg total) by mouth 2 (two) times daily. 20 tablet 0  . QUEtiapine (SEROQUEL) 100 MG tablet Take 1 tablet (100 mg total) by mouth daily. 30 tablet 2   No current facility-administered medications for this visit.     Musculoskeletal: Strength & Muscle Tone: Unable to assess due to telehealth visit Gait & Station: Unable to assess due to telehealth visit Patient leans: N/A  Psychiatric Specialty Exam: Review of Systems  Psychiatric/Behavioral: Positive for behavioral problems, decreased concentration, dysphoric mood and sleep disturbance. The patient is nervous/anxious and is hyperactive.   All other systems reviewed and are negative.   Last menstrual period 05/11/2016.There is no height or weight on file to calculate BMI.  General Appearance: Casual and Well Groomed  Eye Contact:  Good  Speech:  Clear and Coherent and Normal Rate  Volume:  Normal  Mood:  Anxious and Depressed  Affect:  Congruent and Tearful  Thought Process:  Coherent, Goal Directed and Linear  Orientation:  Full (Time, Place, and Person)  Thought Content: WDL and Logical   Suicidal Thoughts:  No  Homicidal Thoughts:  No  Memory:  Immediate;   Good Recent;   Good Remote;   Good  Judgement:  Fair  Insight:  Fair  Psychomotor Activity:  Normal  Concentration:  Concentration: Fair and Attention Span: Fair  Recall:  Good  Fund of Knowledge: Good  Language: Good  Akathisia:  NA  Handed:  Right  AIMS (if indicated): not done  Assets:  Communication Skills Desire for Improvement Housing Social Support  ADL's:  Intact  Cognition: WNL  Sleep:  Poor   Screenings: AIMS   Flowsheet Row [facility]  AIMS Total Score 0    AUDIT   Flowsheet Row [facility] ([facility]  Alcohol Use Disorder Identification Test Final Score (AUDIT) 0    GAD-7   Flowsheet Row Video Visit from 03/22/2020 in [facility]  Total GAD-7 Score 21    PHQ2-9   Flowsheet Row Video Visit from 03/22/2020 in [facility] Office Visit from 08/25/2016 in Primary Care at [facility] Visit from 06/02/2016 in Primary Care at [facility] Visit from 09/28/2015 in Primary Care at [facility] Visit from 07/12/2013 in Primary Care at [facility] Total Score 6 0 3 6 0  PHQ-9 Total Score 21 -- 21 23 --       Assessment and Plan: Patient endorses symptoms of anxiety, depression, ADHD, and hypomania. She is agreeable to increase Wellbutrin Xl 150 mg to 300 mg to help manage symptoms of depression. She will restart Seroquel 100 mg to help manage mood and sleep. Patient offered trazodone to help with sleep however she noted that it was ineffective in the past. Patient also offered hydroxyzine or Buspar however she noted that Klonopin has been the most effective. She is also agreeable to increase Strattera 40 mg to 80 mg to help manage symptoms of ADHD. She will continue all medications as prescribed.   1. Attention deficit hyperactivity disorder (ADHD), predominantly inattentive type  Increased- atomoxetine (STRATTERA) 80 MG capsule; Take 1 capsule (80 mg total) by mouth daily.  Dispense: 30 capsule; Refill: 2  2. Bipolar I disorder, most recent episode depressed (HCC)  Increased- buPROPion (WELLBUTRIN XL) 300 MG 24 hr tablet; Take 1 tablet (300 mg total) by mouth daily.  Dispense: 30 tablet; Refill: 2 Continue- gabapentin (NEURONTIN) 300 MG capsule; Take 1 capsule (300 mg total) by mouth 3 (three) times daily.  Dispense: 90 capsule; Refill: 2 Continue- QUEtiapine (SEROQUEL) 100 MG tablet; Take 1 tablet (100 mg total) by mouth daily.  Dispense: 30 tablet; Refill: 2  3. PTSD  (post-traumatic stress disorder)  Continue- clonazePAM (KLONOPIN) 0.5 MG tablet; Take 0.5 tablets (0.25 mg total) by mouth 3 (three) times daily as needed for anxiety.  Dispense: 45 tablet; Refill: 1  4. Generalized anxiety disorder  Continue- clonazePAM (KLONOPIN) 0.5 MG tablet; Take 0.5 tablets (0.25 mg total) by mouth 3 (three) times daily as needed for anxiety.  Dispense: 45 tablet; Refill: 1    Follow up in two  months Follow up with threapy    Shanna CiscoBrittney E Elizabeth Paulsen, NP 03/22/2020, 8:24 AM

## 2020-04-13 ENCOUNTER — Other Ambulatory Visit: Payer: Self-pay

## 2020-04-13 ENCOUNTER — Ambulatory Visit (HOSPITAL_COMMUNITY)
Admission: EM | Admit: 2020-04-13 | Discharge: 2020-04-13 | Disposition: A | Discharge status: Home / Self Care | Payer: Managed Care, Other (non HMO) | Attending: Family Medicine | Admitting: Family Medicine

## 2020-04-13 ENCOUNTER — Encounter (HOSPITAL_COMMUNITY): Payer: Self-pay | Admitting: Emergency Medicine

## 2020-04-13 DIAGNOSIS — H6121 Impacted cerumen, right ear: Secondary | ICD-10-CM

## 2020-04-13 DIAGNOSIS — H9201 Otalgia, right ear: Secondary | ICD-10-CM | POA: Diagnosis not present

## 2020-04-13 NOTE — ED Triage Notes (Signed)
PT C/O: right ear pain since yesterday   DENIES: f/v/n/d, cold sx  TAKING MEDS: none  A&O x4... NAD... Ambulatory

## 2020-04-13 NOTE — ED Provider Notes (Signed)
  [facility]   [id number] 04/13/20 Arrival Time: 1047  ASSESSMENT & PLAN:  1. Otalgia, right   2. Impacted cerumen of right ear     Hard wax. Ear flushing unsuccessful.   Discharge Instructions     Try using over the counter Debrox drops to dissolve the was in your ear.     Follow-up Information    [facility] at [facility] In 2 days.   Specialty: Urgent Care Contact information: [address] [place] [address] [phone]       Schedule an appointment as soon as possible for a visit  with [facility].   Contact information: [address] [place] [address] [phone]                Reviewed expectations re: course of current medical issues. Questions answered. Outlined signs and symptoms indicating need for more acute intervention. Patient verbalized understanding. After Visit Summary given.   SUBJECTIVE: History from: patient.  Melissa Davies is a 50 y.o. female who presents with complaint of right otalgia; without drainage; without bleeding. Onset gradual, few days. Recent cold symptoms: none. Fever: no. Overall normal PO intake without n/v. Sick contacts: no. OTC treatment: none.  Social History   Tobacco Use  Smoking Status Current Some Day Smoker  . Years: 1.00  . Types: Cigarettes  Smokeless Tobacco Never Used      OBJECTIVE:  Vitals:   04/13/20 1250  BP: 125/83  Pulse: 61  Resp: 18  Temp: 97.8 F (36.6 C)  TempSrc: Oral  SpO2: 100%     General appearance: alert; NAD Ear Canal: normal TM: right: not visualized secondary to cerumen Neck: supple without LAD Lungs: unlabored respirations, symmetrical air entry; cough: absent; no respiratory distress Skin: warm and dry Psychological: alert and cooperative; normal mood and affect  Allergies  Allergen Reactions  . Klonopin [Clonazepam] Rash    Pt is currently on med    Past  Medical History:  Diagnosis Date  . Anxiety    Family History  Problem Relation Age of Onset  . ADD / ADHD Son    Social History   Socioeconomic History  . Marital status: Married    Spouse name: Not on file  . Number of children: Not on file  . Years of education: Not on file  . Highest education level: Not on file  Occupational History  . Occupation: alterations  Tobacco Use  . Smoking status: Current Some Day Smoker    Years: 1.00    Types: Cigarettes  . Smokeless tobacco: Never Used  Vaping Use  . Vaping Use: Never used  Substance and Sexual Activity  . Alcohol use: Not Currently    Comment: occasional   . Drug use: Yes    Types: Marijuana  . Sexual activity: Yes    Partners: Male    Birth control/protection: None  Other Topics Concern  . 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

## 2020-04-13 NOTE — Discharge Instructions (Signed)
Try using over the counter Debrox drops to dissolve the was in your ear.

## 2020-04-19 ENCOUNTER — Emergency Department (HOSPITAL_COMMUNITY)
Admission: EM | Admit: 2020-04-19 | Discharge: 2020-04-19 | Disposition: A | Discharge status: Home / Self Care | Payer: Managed Care, Other (non HMO) | Attending: Emergency Medicine | Admitting: Emergency Medicine

## 2020-04-19 ENCOUNTER — Encounter (HOSPITAL_COMMUNITY): Payer: Self-pay

## 2020-04-19 ENCOUNTER — Other Ambulatory Visit: Payer: Self-pay

## 2020-04-19 DIAGNOSIS — H938X9 Other specified disorders of ear, unspecified ear: Secondary | ICD-10-CM | POA: Diagnosis not present

## 2020-04-19 DIAGNOSIS — Z5321 Procedure and treatment not carried out due to patient leaving prior to being seen by health care provider: Secondary | ICD-10-CM | POA: Insufficient documentation

## 2020-04-19 DIAGNOSIS — H6121 Impacted cerumen, right ear: Secondary | ICD-10-CM | POA: Insufficient documentation

## 2020-04-19 DIAGNOSIS — H9201 Otalgia, right ear: Secondary | ICD-10-CM | POA: Insufficient documentation

## 2020-04-19 HISTORY — DX: Post-traumatic stress disorder, unspecified: F43.10

## 2020-04-19 HISTORY — DX: Attention-deficit hyperactivity disorder, unspecified type: F90.9

## 2020-04-19 NOTE — ED Triage Notes (Signed)
Patient reports that she saw an UC person for earwax and tried OTC meds which she states did not work. Patient decided to use toilet paper to dig the ear wax out and now the toilet paper is stuck in her right ear.

## 2020-05-09 ENCOUNTER — Telehealth (HOSPITAL_COMMUNITY): Payer: Self-pay | Admitting: *Deleted
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
## 2020-05-09 NOTE — Telephone Encounter (Signed)
Provider attempted to call three patient without success. Nursing staff informed provider that she scheduled an appointment for next week. No other concerns noted at this time.

## 2020-05-13 ENCOUNTER — Other Ambulatory Visit: Payer: Self-pay

## 2020-05-13 ENCOUNTER — Encounter (HOSPITAL_COMMUNITY): Payer: Self-pay | Admitting: Psychiatry

## 2020-05-13 ENCOUNTER — Telehealth (INDEPENDENT_AMBULATORY_CARE_PROVIDER_SITE_OTHER): Payer: No Payment, Other | Admitting: Psychiatry

## 2020-05-13 DIAGNOSIS — F4310 Post-traumatic stress disorder, unspecified: Secondary | ICD-10-CM | POA: Diagnosis not present

## 2020-05-13 DIAGNOSIS — F3130 Bipolar disorder, current episode depressed, mild or moderate severity, unspecified: Secondary | ICD-10-CM | POA: Diagnosis not present

## 2020-05-13 DIAGNOSIS — F411 Generalized anxiety disorder: Secondary | ICD-10-CM

## 2020-05-13 DIAGNOSIS — F900 Attention-deficit hyperactivity disorder, predominantly inattentive type: Secondary | ICD-10-CM | POA: Diagnosis not present

## 2020-05-13 MED ORDER — BUPROPION HCL ER (XL) 300 MG PO TB24: 300.0000 mg | ORAL_TABLET | Freq: Every day | ORAL | 2 refills | Status: DC

## 2020-05-13 MED ORDER — CLONAZEPAM 0.5 MG PO TABS: 0.2500 mg | ORAL_TABLET | Freq: Three times a day (TID) | ORAL | 1 refills | Status: DC | PRN

## 2020-05-13 MED ORDER — LAMOTRIGINE 25 MG PO TABS: 25.0000 mg | ORAL_TABLET | Freq: Every day | ORAL | 1 refills | Status: DC

## 2020-05-13 MED ORDER — GABAPENTIN 300 MG PO CAPS: 300.0000 mg | ORAL_CAPSULE | Freq: Three times a day (TID) | ORAL | 2 refills | Status: DC

## 2020-05-13 MED ORDER — QUETIAPINE FUMARATE 100 MG PO TABS: 100.0000 mg | ORAL_TABLET | Freq: Every day | ORAL | 2 refills | Status: DC

## 2020-05-13 MED ORDER — ATOMOXETINE HCL 80 MG PO CAPS: 80.0000 mg | ORAL_CAPSULE | Freq: Every day | ORAL | 2 refills | Status: DC

## 2020-05-13 NOTE — Progress Notes (Signed)
BH MD/PA/NP OP Progress Note Virtual Visit via Telephone Note  I connected with Melissa Davies on 05/13/20 at  8:00 AM EST by telephone and verified that I am speaking with the correct person using two identifiers.  Location: Patient: home Provider: Clinic   I discussed the limitations, risks, security and privacy concerns of performing an evaluation and management service by telephone and the availability of in person appointments. I also discussed with the patient that there may be a patient responsible charge related to this service. The patient expressed understanding and agreed to proceed.   I provided 30 minutes of non-face-to-face time during this encounter.          05/13/2020 10:00 AM Melissa Davies  MRN:  [id number]  Chief Complaint:  "I think I need to try something else. Im very impulsive and I cant control it"  HPI: 50 year old female seen today for follow up psychiatric evaluation. She has a psychiatric history of ADD, depression, PTSD, Bipolar 1, and anxiety. She is currently being managed on Wellbutrin XL 300 daily, Klonopin 0.25 mg 3 times daily,  gabapentin 300 mg 3 times daily,starttera 80 mg, and Seroquel 100 mg.    Today patient unable to login virtually so her exam was done on the phone.  During exam she was tearful, cooperative, and engaged in conversation. She informed provider that lately she has been more irritable, having fluctuations in her mood, and she is unable to control her impulses.  She notes that she spends all of her money on meaningless things, drive excessively fast (received two speeding tickets), has a bad temper, and often is unable to control her mouth (noting that she is rude to people). She noted that she continues to work at [employer] and notes that her irritability is causing problems at work.  She notes that the people at work are mean and that she hates her job but keeps it because of the benefits. Today she describes her mood as  anxious and depressed. Provider conducted a GAD 7 and patient scored a 21, at her last visit she also scored a 21.  Provider also conducted a PHQ-9 and patient scored a 20, at her last visit she scored a 21.  Patient notes that at times she forgets to take her Klonopin and Seroquel.  She notes that she works at night and does not want to be sedated at work.  She informed provider that she only gets 3 hours of sleep at night.  She also notes that her appetite also fluctuates.  Today patient is agreeable to starting Lamictal 25 mg for 2 weeks and then increasing it to 50 mg to help stabilize mood.  Potential side effects of medication and risks vs benefits of treatment vs non-treatment were explained and discussed. All questions were answered.She is also agreeable to taking Seroquel 100 mg during the day (because she works night shift and sleeps in the day).  She will continue all other medications as prescribed.  No other concerns noted at this time.    Visit Diagnosis:    ICD-10-CM   1. Attention deficit hyperactivity disorder (ADHD), predominantly inattentive type  F90.0 atomoxetine (STRATTERA) 80 MG capsule  2. Bipolar I disorder, most recent episode depressed (HCC)  F31.30 buPROPion (WELLBUTRIN XL) 300 MG 24 hr tablet    QUEtiapine (SEROQUEL) 100 MG tablet    lamoTRIgine (LAMICTAL) 25 MG tablet    gabapentin (NEURONTIN) 300 MG capsule  3. PTSD (post-traumatic stress disorder)  F43.10 clonazePAM (KLONOPIN) 0.5 MG tablet  4. Generalized anxiety disorder  F41.1 clonazePAM (KLONOPIN) 0.5 MG tablet    Past Psychiatric History: Bipolar disorder, PTSD, MDD, GAD  Past Medical History:  Past Medical History:  Diagnosis Date  . ADHD   . Anxiety   . PTSD (post-traumatic stress disorder)     Past Surgical History:  Procedure Laterality Date  . TUBAL LIGATION      Family Psychiatric History: unknown  Family History:  Family History  Problem Relation Age of Onset  . ADD / ADHD Son      Social History:  Social History   Socioeconomic History  . Marital status: Married    Spouse name: Not on file  . Number of children: Not on file  . Years of education: Not on file  . Highest education level: Not on file  Occupational History  . Occupation: alterations  Tobacco Use  . Smoking status: Current Some Day Smoker    Years: 1.00    Types: Cigarettes  . Smokeless tobacco: Never Used  Vaping Use  . Vaping Use: Never used  Substance and Sexual Activity  . Alcohol use: Not Currently  . Drug use: Yes    Types: Marijuana  . Sexual activity: Yes    Partners: Male    Birth control/protection: None  Other Topics Concern  . 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 Klonopin [Clonazepam] Rash    Pt is currently on med    Metabolic Disorder Labs: No results found for: HGBA1C, MPG No results found for: PROLACTIN No results found for: CHOL, TRIG, HDL, CHOLHDL, VLDL, LDLCALC No results found for: TSH  Therapeutic Level Labs: No results found for: LITHIUM No results found for: VALPROATE No components found for:  CBMZ  Current Medications: Current Outpatient Medications  Medication Sig Dispense Refill  . lamoTRIgine (LAMICTAL) 25 MG tablet Take 1 tablet (25 mg total) by mouth daily. 45 tablet 1  . atomoxetine (STRATTERA) 80 MG capsule Take 1 capsule (80 mg total) by mouth daily. 30 capsule 2  . buPROPion (WELLBUTRIN XL) 300 MG 24 hr tablet Take 1 tablet (300 mg total) by mouth daily. 30 tablet 2  . clonazePAM (KLONOPIN) 0.5 MG tablet Take 0.5 tablets (0.25 mg total) by mouth 3 (three) times daily as needed for  anxiety. 45 tablet 1  . gabapentin (NEURONTIN) 300 MG capsule Take 1 capsule (300 mg total) by mouth 3 (three) times daily. 90 capsule 2  . lidocaine (LIDODERM) 5 % Place 1 patch onto the skin daily. Remove & Discard patch within 12 hours or as directed by MD 14 patch 0  . loratadine (CLARITIN) 10 MG tablet Take 10 mg by mouth daily as needed for allergies.    . methocarbamol (ROBAXIN) 500 MG tablet Take 1 tablet (500 mg total) by mouth 2 (two) times daily. 20 tablet 0  . QUEtiapine (SEROQUEL) 100 MG tablet Take 1 tablet (100 mg total) by mouth daily. 30 tablet 2   No current facility-administered medications for this visit.     Musculoskeletal: Strength & Muscle Tone: Unable to  assess due to telehealth visit Gait & Station: Unable to assess due to telehealth visit Patient leans: N/A  Psychiatric Specialty Exam:     General Appearance: Casual and Well Groomed  Eye Contact:  Good  Speech:  Clear and Coherent and Normal Rate  Volume:  Normal  Mood:  Anxious and Depressed  Affect:  Congruent and Tearful  Thought Process:  Coherent, Goal Directed and Linear  Orientation:  Full (Time, Place, and Person)  Thought Content: WDL and Logical   Suicidal Thoughts:  No  Homicidal Thoughts:  No  Memory:  Immediate;   Good Recent;   Good Remote;   Good  Judgement:  Fair  Insight:  Fair  Psychomotor Activity:  Normal  Concentration:  Concentration: Fair and Attention Span: Fair  Recall:  Good  Fund of Knowledge: Good  Language: Good  Akathisia:  NA  Handed:  Right  AIMS (if indicated): not done  Assets:  Communication Skills Desire for Improvement Housing Social Support  ADL's:  Intact  Cognition: WNL  Sleep:  Poor   Screenings: AIMS   Flowsheet Row [facility]  AIMS Total Score 0    AUDIT   Flowsheet Row [facility]  Alcohol  Use Disorder Identification Test Final Score (AUDIT) 0    GAD-7   Flowsheet Row Video Visit from 05/13/2020 in [facility] Video Visit from 03/22/2020 in [facility]  Total GAD-7 Score 21 21    PHQ2-9   Flowsheet Row Video Visit from 05/13/2020 in [facility] Video Visit from 03/22/2020 in [facility] Office Visit from 08/25/2016 in Primary Care at [facility] Visit from 06/02/2016 in Primary Care at [facility] Visit from 09/28/2015 in Primary Care at [facility] Total Score 6 6 0 3 6  PHQ-9 Total Score 20 21 -- 21 23       Assessment and Plan: Patient endorses symptoms of anxiety, depression, insomnia and hypomania.Today patient is agreeable to starting Lamictal 25 mg for 2 weeks and then increasing it to 50 mg to help stabilize mood. She is also agreeable to taking Seroquel 100 mg during the day (because she works night shift and sleeps in the day).  She will continue all other medications as prescribed.  1. Attention deficit hyperactivity disorder (ADHD), predominantly inattentive type  Continue- atomoxetine (STRATTERA) 80 MG capsule; Take 1 capsule (80 mg total) by mouth daily.  Dispense: 30 capsule; Refill: 2  2. Bipolar I disorder, most recent episode depressed (HCC)  Continue- buPROPion (WELLBUTRIN XL) 300 MG 24 hr tablet; Take 1 tablet (300 mg total) by mouth daily.  Dispense: 30 tablet; Refill: 2 Continue- QUEtiapine (SEROQUEL) 100 MG tablet; Take 1 tablet (100 mg total) by mouth daily.  Dispense: 30 tablet; Refill: 2 Continue- gabapentin (NEURONTIN) 300 MG capsule; Take 1 capsule (300 mg total) by mouth 3 (three) times daily.  Dispense: 90 capsule; Refill: 2 Start- lamoTRIgine (LAMICTAL) 25 MG tablet; Take 1 tablet (25 mg total) by mouth daily.  Dispense: 45 tablet; Refill: 1    3. PTSD (post-traumatic stress disorder)  Continue- clonazePAM (KLONOPIN) 0.5 MG  tablet; Take 0.5 tablets (0.25 mg total) by mouth 3 (three) times daily as needed for anxiety.  Dispense: 45 tablet; Refill: 1  4. Generalized anxiety disorder  Continue- clonazePAM (KLONOPIN) 0.5 MG tablet; Take 0.5 tablets (0.25  mg total) by mouth 3 (three) times daily as needed for anxiety.  Dispense: 45 tablet; Refill: 1    Follow up in one months Follow up with threapy    Shanna Cisco, NP 05/13/2020, 10:00 AM

## 2020-05-14 ENCOUNTER — Encounter: Payer: Self-pay | Admitting: Obstetrics and Gynecology

## 2020-05-14 ENCOUNTER — Other Ambulatory Visit: Payer: Self-pay

## 2020-05-14 ENCOUNTER — Other Ambulatory Visit (HOSPITAL_COMMUNITY)
Admission: RE | Admit: 2020-05-14 | Discharge: 2020-05-14 | Disposition: A | Discharge status: Home / Self Care | Payer: Managed Care, Other (non HMO) | Source: Ambulatory Visit | Attending: Obstetrics and Gynecology | Admitting: Obstetrics and Gynecology

## 2020-05-14 ENCOUNTER — Ambulatory Visit (INDEPENDENT_AMBULATORY_CARE_PROVIDER_SITE_OTHER): Payer: Managed Care, Other (non HMO) | Admitting: Obstetrics and Gynecology

## 2020-05-14 VITALS — BP 121/84 | HR 77 | Ht 60.0 in | Wt 175.0 lb

## 2020-05-14 DIAGNOSIS — Z1231 Encounter for screening mammogram for malignant neoplasm of breast: Secondary | ICD-10-CM | POA: Diagnosis not present

## 2020-05-14 DIAGNOSIS — N898 Other specified noninflammatory disorders of vagina: Secondary | ICD-10-CM

## 2020-05-14 DIAGNOSIS — Z113 Encounter for screening for infections with a predominantly sexual mode of transmission: Secondary | ICD-10-CM | POA: Insufficient documentation

## 2020-05-14 DIAGNOSIS — Z124 Encounter for screening for malignant neoplasm of cervix: Secondary | ICD-10-CM

## 2020-05-14 DIAGNOSIS — A5602 Chlamydial vulvovaginitis: Secondary | ICD-10-CM | POA: Insufficient documentation

## 2020-05-14 DIAGNOSIS — Z01419 Encounter for gynecological examination (general) (routine) without abnormal findings: Secondary | ICD-10-CM

## 2020-05-14 NOTE — Progress Notes (Signed)
Pt does need mammo. Has not had one in several years.  Pt does need PCP.

## 2020-05-14 NOTE — Progress Notes (Signed)
GYNECOLOGY ANNUAL PREVENTATIVE CARE ENCOUNTER NOTE  Subjective:   Melissa Davies is a 50 y.o.  female here for a annual gynecologic exam. Current complaints: needs pap, establish care, has not had GYN care in several years.    Denies abnormal vaginal bleeding, discharge, pelvic pain, problems with intercourse or other gynecologic concerns. Accepts STI screen. Dx with gonorrhea last year, txtd and tested negative subsequently but wants to be retested.  In middle of possible divorce with husband, has been contentious. This is a big source of stress for her.    Gynecologic History Patient's last menstrual period was 05/11/2016 (approximate). Contraception: post menopausal status Last Pap: > 7 years Last mammogram: > 15 years DEXA: has never had  Obstetric History OB History  Gravida Para Term Preterm AB Living  3 3 3     3   SAB IAB Ectopic Multiple Live Births          3    # Outcome Date GA Lbr Len/2nd Weight Sex Delivery Anes PTL Lv  3 Term      Vag-Spont   LIV  2 Term      Vag-Spont   LIV  1 Term      Vag-Spont   LIV    Past Medical History:  Diagnosis Date  . ADHD   . Anxiety   . PTSD (post-traumatic stress disorder)     Past Surgical History:  Procedure Laterality Date  . TUBAL LIGATION      Current Outpatient Medications on File Prior to Visit  Medication Sig Dispense Refill  . atomoxetine (STRATTERA) 80 MG capsule Take 1 capsule (80 mg total) by mouth daily. 30 capsule 2  . buPROPion (WELLBUTRIN XL) 300 MG 24 hr tablet Take 1 tablet (300 mg total) by mouth daily. 30 tablet 2  . clonazePAM (KLONOPIN) 0.5 MG tablet Take 0.5 tablets (0.25 mg total) by mouth 3 (three) times daily as needed for anxiety. 45 tablet 1  . gabapentin (NEURONTIN) 300 MG capsule Take 1 capsule (300 mg total) by mouth 3 (three) times daily. 90 capsule 2  . lamoTRIgine (LAMICTAL) 25 MG tablet Take 1 tablet (25 mg total) by mouth daily. 45 tablet 1  . QUEtiapine (SEROQUEL) 100 MG  tablet Take 1 tablet (100 mg total) by mouth daily. 30 tablet 2  . lidocaine (LIDODERM) 5 % Place 1 patch onto the skin daily. Remove & Discard patch within 12 hours or as directed by MD (Patient not taking: Reported on 05/14/2020) 14 patch 0  . loratadine (CLARITIN) 10 MG tablet Take 10 mg by mouth daily as needed for allergies. (Patient not taking: Reported on 05/14/2020)    . methocarbamol (ROBAXIN) 500 MG tablet Take 1 tablet (500 mg total) by mouth 2 (two) times daily. (Patient not taking: Reported on 05/14/2020) 20 tablet 0  . [DISCONTINUED] FLUoxetine (PROZAC) 20 MG capsule Take 1 capsule (20 mg total) by mouth daily. (Patient not taking: Reported on 10/31/2019) 30 capsule 0   No current facility-administered medications on file prior to visit.    No Active Allergies  Social History   Socioeconomic History  . Marital status: Married    Spouse name: Not on file  . Number of children: Not on file  . Years of education: Not on file  . Highest education level: Not on file  Occupational History  . Occupation: alterations  Tobacco Use  . Smoking status: Former Smoker    Years: 1.00    Types: Cigarettes  .  Smokeless tobacco: Never Used  Vaping Use  . Vaping Use: Never used  Substance and Sexual Activity  . Alcohol use: Not Currently  . Drug use: Yes    Types: Marijuana  . Sexual activity: Yes    Partners: Male    Birth control/protection: None  Other Topics Concern  . 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 ADD / ADHD Son     The following portions of the patient's history were reviewed  and updated as appropriate: allergies, current medications, past family history, past medical history, past social history, past surgical history and problem list.  Review of Systems Pertinent items are noted in HPI.   Objective:  BP 121/84   Pulse 77   Ht 5' (1.524 m)   Wt 175 lb (79.4 kg)   LMP 05/11/2016 (Approximate)   BMI 34.18 kg/m  CONSTITUTIONAL: Well-developed, well-nourished female in no acute distress.  HENT:  Normocephalic, atraumatic, External right and left ear normal. Oropharynx is clear and moist EYES: Conjunctivae and EOM are normal. Pupils are equal, round, and reactive to light. No scleral icterus.  NECK: Normal range of motion, supple, no masses.  Normal thyroid.  SKIN: Skin is warm and dry. No rash noted. Not diaphoretic. No erythema. No pallor. NEUROLOGIC: Alert and oriented to person, place, and time. Normal reflexes, muscle tone coordination. No cranial nerve deficit noted. PSYCHIATRIC: Normal mood and affect. Normal behavior. Normal judgment and thought content. CARDIOVASCULAR: Normal heart rate noted RESPIRATORY: Effort normal, no problems with respiration noted. BREASTS: Symmetric in size. No masses, skin changes, nipple drainage, or lymphadenopathy. ABDOMEN: Soft, no distention noted.  No tenderness, rebound or guarding.  PELVIC: Normal appearing external genitalia; normal appearing vaginal mucosa and cervix.  No abnormal discharge noted.  Pap smear obtained. Pelvic cultures obtained. Normal uterine size, no other palpable masses, no uterine or adnexal tenderness. MUSCULOSKELETAL: Normal range of motion. No tenderness.  No cyanosis, clubbing, or edema.  2+ distal pulses.  Exam done with chaperone present.   Assessment and Plan:   1. Well woman exam Healthy female exam - Ambulatory referral to Family Practice - Ambulatory referral to Gastroenterology  2. Cervical cancer screening - Cytology - PAP( [facility])  3. Encounter for screening mammogram  for malignant neoplasm of breast mammo ordered today  4. Routine screening for STI (sexually transmitted infection) - Hepatitis B surface antigen - Hepatitis C antibody - HIV Antibody (routine testing w rflx) - [facility]  5. Vaginal discharge - Cervicovaginal ancillary only( [place])    Will follow up results of pap smear/STI screen and manage accordingly. Encouraged improvement in diet and exercise.  COVID vaccine UTD Accepts STI screen. Mammogram ordered Referral for colonoscopy today Flu vaccine UTD DEXA not due based on age  Routine preventative health maintenance measures emphasized. Please refer to After Visit Summary for other counseling recommendations.   Baldemar Lenis, MD, [facility] Attending Center for [facility])

## 2020-05-15 LAB — CERVICOVAGINAL ANCILLARY ONLY
Bacterial Vaginitis (gardnerella): NEGATIVE
Candida Glabrata: NEGATIVE
Candida Vaginitis: NEGATIVE
Chlamydia: POSITIVE — AB
Comment: NEGATIVE
Comment: NEGATIVE
Comment: NEGATIVE
Comment: NEGATIVE
Comment: NEGATIVE
Comment: NORMAL
Neisseria Gonorrhea: NEGATIVE
Trichomonas: NEGATIVE

## 2020-05-15 LAB — HEPATITIS C ANTIBODY: Hep C Virus Ab: 0.1 s/co ratio (ref 0.0–0.9)

## 2020-05-15 LAB — RPR: RPR Ser Ql: NONREACTIVE

## 2020-05-15 LAB — HIV ANTIBODY (ROUTINE TESTING W REFLEX): HIV Screen 4th Generation wRfx: NONREACTIVE

## 2020-05-15 LAB — HEPATITIS B SURFACE ANTIGEN: Hepatitis B Surface Ag: NEGATIVE

## 2020-05-16 ENCOUNTER — Telehealth: Payer: Self-pay

## 2020-05-16 NOTE — Telephone Encounter (Signed)
Tried to reach pt by phone @ 2:31pm to make aware of +CT  Pt not ava lvm.

## 2020-05-16 NOTE — Telephone Encounter (Signed)
-----   Message from Conan Bowens, MD sent at 05/16/2020  2:17 PM EST ----- Please call and let patient know she is positive for chlamydia, needs treatment Labs otherwise normal

## 2020-05-17 ENCOUNTER — Other Ambulatory Visit: Payer: Self-pay

## 2020-05-17 DIAGNOSIS — A749 Chlamydial infection, unspecified: Secondary | ICD-10-CM

## 2020-05-17 LAB — CYTOLOGY - PAP
Comment: NEGATIVE
Diagnosis: NEGATIVE
Diagnosis: REACTIVE
High risk HPV: NEGATIVE

## 2020-05-17 MED ORDER — AZITHROMYCIN 500 MG PO TABS: 1000.0000 mg | ORAL_TABLET | Freq: Once | ORAL | 0 refills | Status: AC

## 2020-05-17 MED ORDER — AZITHROMYCIN 500 MG PO TABS: 1000.0000 mg | ORAL_TABLET | Freq: Once | ORAL | Status: DC

## 2020-05-17 NOTE — Progress Notes (Signed)
Pt made aware of +CT results  Rx sent  Pt made aware to refrain from unprotected intercourse  Will need TOC in 4-6 wks  Forms faxed to [facility].

## 2020-05-17 NOTE — Progress Notes (Signed)
Rx was doc in error as administered in office.  Rx sent to pharm.

## 2020-05-24 ENCOUNTER — Telehealth (HOSPITAL_COMMUNITY): Payer: No Payment, Other | Admitting: Psychiatry

## 2020-05-30 ENCOUNTER — Encounter: Payer: Self-pay | Admitting: Gastroenterology

## 2020-06-10 ENCOUNTER — Telehealth (INDEPENDENT_AMBULATORY_CARE_PROVIDER_SITE_OTHER): Payer: 59 | Admitting: Psychiatry

## 2020-06-10 ENCOUNTER — Encounter (HOSPITAL_COMMUNITY): Payer: Self-pay | Admitting: Psychiatry

## 2020-06-10 ENCOUNTER — Other Ambulatory Visit: Payer: Self-pay

## 2020-06-10 DIAGNOSIS — F4310 Post-traumatic stress disorder, unspecified: Secondary | ICD-10-CM

## 2020-06-10 DIAGNOSIS — F411 Generalized anxiety disorder: Secondary | ICD-10-CM

## 2020-06-10 DIAGNOSIS — F3130 Bipolar disorder, current episode depressed, mild or moderate severity, unspecified: Secondary | ICD-10-CM | POA: Diagnosis not present

## 2020-06-10 DIAGNOSIS — F900 Attention-deficit hyperactivity disorder, predominantly inattentive type: Secondary | ICD-10-CM | POA: Diagnosis not present

## 2020-06-10 MED ORDER — BUPROPION HCL ER (XL) 300 MG PO TB24: 300.0000 mg | ORAL_TABLET | Freq: Every day | ORAL | 2 refills | Status: DC

## 2020-06-10 MED ORDER — CLONAZEPAM 0.5 MG PO TABS: 0.2500 mg | ORAL_TABLET | Freq: Three times a day (TID) | ORAL | 1 refills | Status: DC | PRN

## 2020-06-10 MED ORDER — ATOMOXETINE HCL 80 MG PO CAPS: 80.0000 mg | ORAL_CAPSULE | Freq: Every day | ORAL | 2 refills | Status: DC

## 2020-06-10 MED ORDER — GABAPENTIN 300 MG PO CAPS
300.0000 mg | ORAL_CAPSULE | Freq: Three times a day (TID) | ORAL | 2 refills | Status: DC
Start: 2020-06-10 — End: 2020-07-09

## 2020-06-10 MED ORDER — LAMOTRIGINE 25 MG PO TABS
50.0000 mg | ORAL_TABLET | Freq: Every day | ORAL | 1 refills | Status: DC
Start: 2020-06-10 — End: 2020-07-09

## 2020-06-10 MED ORDER — QUETIAPINE FUMARATE 100 MG PO TABS: 100.0000 mg | ORAL_TABLET | Freq: Every day | ORAL | 2 refills | Status: DC

## 2020-06-10 NOTE — Progress Notes (Signed)
BH MD/PA/NP OP Progress Note Virtual Visit via Telephone Note  I connected with Melissa Davies on 06/10/20 at 10:30 AM EST by telephone and verified that I am speaking with the correct person using two identifiers.  Location: Patient: home Provider: Clinic   I discussed the limitations, risks, security and privacy concerns of performing an evaluation and management service by telephone and the availability of in person appointments. I also discussed with the patient that there may be a patient responsible charge related to this service. The patient expressed understanding and agreed to proceed.   I provided 30 minutes of non-face-to-face time during this encounter.          06/10/2020 11:49 AM Regan RakersRoberta Ann Orzechowski  MRN:  [id number]  Chief Complaint:  "Things are a little better but I get irritable and am not in control"  HPI: 50 year old female seen today for follow up psychiatric evaluation. She has a psychiatric history of ADD, depression, PTSD, Bipolar 1, and anxiety. She is currently being managed on Wellbutrin XL 300 daily, Klonopin 0.25 mg 3 times daily,  gabapentin 300 mg 3 times daily,starttera 80 mg, Lamictal 50 mg daily and Seroquel 100 mg daily (takes in the daytime because she works at night).    Today patient pleasant, cooperative, and engaged in conversation.  She informed provider that things have been a little better since her last visit.  She notes that her anxiety and depression has somewhat improved however notes that she is still irritable and having difficulty controlling her anger.  Today provider conducted a GAD-7 and patient scored a 14, at her last visit she scored a 21.  Provider also conducted a PHQ-9 and patient scored a 13, at her last visit she scored a 20.  Patient notes that she forgot to increase her Lamictal to 50 mg and notes that she has only been taking 25 mg.  She endorses symptoms of hypomania such as distractibility, irritability, fluctuations  in mood, and racing thoughts.  She notes that she becomes most irritable with people at work.  She informed provider that she continues to work at [employer] and notes that her coworkers become angry with her when she questions them or encourages them to do better.  Today she denies SI/HI/VAH or paranoia.  Patient notes that she has a poor appetite and notes that she is losing weight.  She informed provider that Seroquel helps manage her sleeps and notes that she is sleeping well.   Patient notes that since her last visit she quit smoking cigarettes.  She also notes that she joined a gym and reports that she is trying to work on her physical and mental health.  Today patient is agreeable to increasini Lamictal 25 to 50 mg for 2 weeks and then increasing to 50 mg to 75 mg to help stabilize mood.  She will continue all other medications as prescribed.  No other concerns noted at this time.    Visit Diagnosis:    ICD-10-CM   1. Attention deficit hyperactivity disorder (ADHD), predominantly inattentive type  F90.0 atomoxetine (STRATTERA) 80 MG capsule  2. Bipolar I disorder, most recent episode depressed (HCC)  F31.30 buPROPion (WELLBUTRIN XL) 300 MG 24 hr tablet    gabapentin (NEURONTIN) 300 MG capsule    lamoTRIgine (LAMICTAL) 25 MG tablet    QUEtiapine (SEROQUEL) 100 MG tablet  3. PTSD (post-traumatic stress disorder)  F43.10 clonazePAM (KLONOPIN) 0.5 MG tablet  4. Generalized anxiety disorder  F41.1 clonazePAM (KLONOPIN)  0.5 MG tablet    Past Psychiatric History: Bipolar disorder, PTSD, MDD, GAD  Past Medical History:  Past Medical History:  Diagnosis Date  . ADHD   . Anxiety   . PTSD (post-traumatic stress disorder)     Past Surgical History:  Procedure Laterality Date  . TUBAL LIGATION      Family Psychiatric History: unknown  Family History:  Family History  Problem Relation Age of Onset  . ADD / ADHD Son     Social History:  Social History   Socioeconomic History  .  Marital status: Married    Spouse name: Not on file  . Number of children: Not on file  . Years of education: Not on file  . Highest education level: Not on file  Occupational History  . Occupation: alterations  Tobacco Use  . Smoking status: Former Smoker    Years: 1.00    Types: Cigarettes  . Smokeless tobacco: Never Used  Vaping Use  . Vaping Use: Never used  Substance and Sexual Activity  . Alcohol use: Not Currently  . Drug use: Yes    Types: Marijuana  . Sexual activity: Yes    Partners: Male    Birth control/protection: None  Other Topics Concern  . 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   Social Determinants of Health   Financial Resource Strain: Not on file  Food Insecurity: Not on file  Transportation Needs: Not on file  Physical Activity: Not on file  Stress: Not on file  Social Connections: Not on file    Allergies:  No Active Allergies  Metabolic Disorder Labs: No results found for: HGBA1C, MPG No results found for: PROLACTIN No results found for: CHOL, TRIG, HDL, CHOLHDL, VLDL, LDLCALC No results found for: TSH  Therapeutic Level Labs: No results found for: LITHIUM No results found for: VALPROATE No components found for:  CBMZ  Current Medications: Current Outpatient Medications  Medication Sig Dispense Refill  . atomoxetine (STRATTERA) 80 MG capsule Take 1 capsule (80 mg total) by mouth daily. 30 capsule 2  . buPROPion (WELLBUTRIN XL) 300 MG 24 hr tablet Take 1 tablet (300 mg total) by mouth daily. 30 tablet 2  . clonazePAM (KLONOPIN) 0.5 MG tablet Take 0.5 tablets (0.25 mg total) by mouth 3 (three) times daily as needed for anxiety. 45 tablet 1  . gabapentin (NEURONTIN) 300 MG capsule Take 1 capsule (300 mg total) by mouth 3 (three) times daily. 90 capsule 2  . lamoTRIgine (LAMICTAL) 25 MG tablet Take 2 tablets (50 mg total) by mouth daily. 120 tablet 1  . lidocaine  (LIDODERM) 5 % Place 1 patch onto the skin daily. Remove & Discard patch within 12 hours or as directed by MD (Patient not taking: Reported on 05/14/2020) 14 patch 0  . loratadine (CLARITIN) 10 MG tablet Take 10 mg by mouth daily as needed for allergies. (Patient not taking: Reported on 05/14/2020)    . methocarbamol (ROBAXIN) 500 MG tablet Take 1 tablet (500 mg total) by mouth 2 (two) times daily. (Patient not taking: Reported on 05/14/2020) 20 tablet 0  . QUEtiapine (SEROQUEL) 100 MG tablet Take 1 tablet (100 mg total) by mouth daily. 30 tablet 2   Current Facility-Administered Medications  Medication Dose Route Frequency Provider Last Rate Last Admin  . azithromycin (ZITHROMAX) tablet 1,000 mg  1,000 mg Oral Once Conan Bowens, MD  Musculoskeletal: Strength & Muscle Tone: Unable to assess due to telehealth visit Gait & Station: Unable to assess due to telehealth visit Patient leans: N/A  Psychiatric Specialty Exam:     General Appearance: Casual and Well Groomed  Eye Contact:  Good  Speech:  Clear and Coherent and Normal Rate  Volume:  Normal  Mood:  Anxious and Depressed  Affect:  Appropriate and Congruent  Thought Process:  Coherent, Goal Directed and Linear  Orientation:  Full (Time, Place, and Person)  Thought Content: WDL and Logical   Suicidal Thoughts:  No  Homicidal Thoughts:  No  Memory:  Immediate;   Good Recent;   Good Remote;   Good  Judgement:  Fair  Insight:  Fair  Psychomotor Activity:  Normal  Concentration:  Concentration: Good and Attention Span: Good  Recall:  Good  Fund of Knowledge: Good  Language: Good  Akathisia:  NA  Handed:  Right  AIMS (if indicated): not done  Assets:  Communication Skills Desire for Improvement Housing Social Support  ADL's:  Intact  Cognition: WNL  Sleep:  Good   Screenings: AIMS   Flowsheet Row [facility]  AIMS Total Score 0    AUDIT    Flowsheet Row [facility]  Alcohol Use Disorder Identification Test Final Score (AUDIT) 0    GAD-7   Flowsheet Row [facility] Visit from 06/10/2020 in [facility] Video Visit from 05/13/2020 in [facility] Video Visit from 03/22/2020 in [facility]  Total GAD-7 Score 14 21 21     PHQ2-9   Flowsheet Row Video Visit from 06/10/2020 in [facility] Video Visit from 05/13/2020 in [facility] Video Visit from 03/22/2020 in [facility] Office Visit from 08/25/2016 in Primary Care at [facility] Visit from 06/02/2016 in Primary Care at [facility] Total Score 6 6 6  0 3  PHQ-9 Total Score 13 20 21  -- 21    Flowsheet Row Video Visit from 06/10/2020 in [facility] Ad[facility] (Discharged) [facility] ED from 07/21/2019 in [facility] EMERGENCY DEPARTMENT  C-SSRS RISK CATEGORY No Risk Moderate Risk High Risk       Assessment and Plan: Patient notes that her anxiety, depression, insomnia have improved since her last visit She however notes that she still has symptoms of hypomania. Today patient is agreeable to increasing Lamictal 25 to 50 mg for two weeks and then increasing 50 mg to 75 mg to help stabilize mood.   1. Attention deficit hyperactivity disorder (ADHD), predominantly inattentive type  Continue- atomoxetine (STRATTERA) 80 MG capsule; Take 1 capsule (80 mg total) by mouth daily.  Dispense: 30 capsule; Refill: 2  2. Bipolar I disorder, most recent episode depressed (HCC)  Continue- buPROPion (WELLBUTRIN XL) 300 MG 24 hr tablet; Take 1 tablet (300 mg total) by mouth daily.  Dispense: 30 tablet; Refill: 2 Continue- QUEtiapine (SEROQUEL) 100 MG tablet; Take 1 tablet  (100 mg total) by mouth daily.  Dispense: 30 tablet; Refill: 2 Continue- gabapentin (NEURONTIN) 300 MG capsule; Take 1 capsule (300 mg total) by mouth 3 (three) times daily.  Dispense: 90 capsule; Refill: 2 Start- lamoTRIgine (LAMICTAL) 50 MG tablet; Take 1 tablet (50 mg total) by mouth daily.  Dispense: 45 tablet; Refill: 1  3. PTSD (post-traumatic stress disorder)  Continue- clonazePAM (KLONOPIN) 0.5 MG tablet; Take 0.5 tablets (0.25 mg total) by mouth 3 (three) times daily as needed for anxiety.  Dispense: 45 tablet; Refill: 1  4. Generalized anxiety disorder  Continue- clonazePAM (KLONOPIN) 0.5 MG tablet; Take 0.5 tablets (0.25 mg total) by mouth 3 (three) times daily as needed for anxiety.  Dispense: 45 tablet; Refill: 1    Follow up in one months Follow up with threapy    Shanna Cisco, NP 06/10/2020, 11:49 AM

## 2020-07-08 ENCOUNTER — Telehealth (HOSPITAL_COMMUNITY): Payer: 59 | Admitting: Psychiatry

## 2020-07-08 ENCOUNTER — Other Ambulatory Visit: Payer: Self-pay

## 2020-07-09 ENCOUNTER — Other Ambulatory Visit: Payer: Self-pay

## 2020-07-09 ENCOUNTER — Encounter (HOSPITAL_COMMUNITY): Payer: Self-pay | Admitting: Psychiatry

## 2020-07-09 ENCOUNTER — Telehealth (INDEPENDENT_AMBULATORY_CARE_PROVIDER_SITE_OTHER): Payer: 59 | Admitting: Psychiatry

## 2020-07-09 DIAGNOSIS — F900 Attention-deficit hyperactivity disorder, predominantly inattentive type: Secondary | ICD-10-CM | POA: Diagnosis not present

## 2020-07-09 DIAGNOSIS — F3130 Bipolar disorder, current episode depressed, mild or moderate severity, unspecified: Secondary | ICD-10-CM | POA: Diagnosis not present

## 2020-07-09 DIAGNOSIS — F411 Generalized anxiety disorder: Secondary | ICD-10-CM | POA: Diagnosis not present

## 2020-07-09 DIAGNOSIS — F4310 Post-traumatic stress disorder, unspecified: Secondary | ICD-10-CM

## 2020-07-09 MED ORDER — QUETIAPINE FUMARATE 50 MG PO TABS
150.0000 mg | ORAL_TABLET | Freq: Every day | ORAL | 2 refills | Status: DC
Start: 2020-07-09 — End: 2020-07-23

## 2020-07-09 MED ORDER — GABAPENTIN 300 MG PO CAPS
300.0000 mg | ORAL_CAPSULE | Freq: Three times a day (TID) | ORAL | 2 refills | Status: DC
Start: 2020-07-09 — End: 2020-07-23

## 2020-07-09 MED ORDER — LAMOTRIGINE 100 MG PO TABS: 100.0000 mg | ORAL_TABLET | Freq: Every day | ORAL | 2 refills | Status: DC

## 2020-07-09 MED ORDER — CLONAZEPAM 0.5 MG PO TABS
0.2500 mg | ORAL_TABLET | Freq: Three times a day (TID) | ORAL | 1 refills | Status: DC | PRN
Start: 2020-07-09 — End: 2020-07-23

## 2020-07-09 MED ORDER — ATOMOXETINE HCL 80 MG PO CAPS: 80.0000 mg | ORAL_CAPSULE | Freq: Every day | ORAL | 2 refills | Status: DC

## 2020-07-09 MED ORDER — BUPROPION HCL ER (XL) 300 MG PO TB24
300.0000 mg | ORAL_TABLET | Freq: Every day | ORAL | 2 refills | Status: DC
Start: 2020-07-09 — End: 2020-07-23

## 2020-07-09 NOTE — Progress Notes (Signed)
BH MD/PA/NP OP Progress Note Virtual Visit via Video Note  I connected with Melissa MottsRoberta Ann Davies on 07/09/20 at  1:30 PM EDT by a video enabled telemedicine application and verified that I am speaking with the correct person using two identifiers.  Location: Patient: Home Provider: Clinic   I discussed the limitations of evaluation and management by telemedicine and the availability of in person appointments. The patient expressed understanding and agreed to proceed.  I provided 30 minutes of non-face-to-face time during this encounter.             07/09/2020 2:13 PM Regan RakersRoberta Ann Davies  MRN:  [id number]  Chief Complaint:  "I don't like who I am"  HPI: 50 year old female seen today for follow up psychiatric evaluation. She has a psychiatric history of ADD, depression, PTSD, Bipolar 1, and anxiety. She is currently being managed on Wellbutrin XL 300 daily, Klonopin 0.25 mg 3 times daily,  gabapentin 300 mg 3 times daily,starttera 80 mg, Lamictal 75 mg daily and Seroquel 100 mg daily (takes in the daytime because she works at night).  She notes her medications are somewhat effective in managing her psychiatric conditions  Today patient pleasant, cooperative, engaged in conversation, tearful, and maintained eye contact.  She informed provider that she dislikes herself because she is irritable and snappy.  She notes that her mood continues to fluctuate, has racing thoughts, as distractibility, and impulsiveness.  She informed provider that her sleep is well.  Patient notes that her anxiety and depression has worsened since her last visit.   Today provider conducted a GAD-7 and patient scored a 21, at her last visit she scored a 14.  Provider also conducted a PHQ-9 and patient scored a 15, at her last visit she scored a 13.  Today she denies SI/HI/VAH or paranoia.  Patient notes that her appetite and sleep are adequate.    Patient reports that she continues to work on her physical health  by going to the gym.  She notes that she believes that she feels physically healthy she will feel mentally healthy as well.  Today she is agreeable to increasing Seroquel 100 mg to 150 mg to help manage mood.  She is also agreeable to increase Lamictal 75 mg to 100 mg to help manage mood.  She will continue all other medication as prescribed.  No other concerns noted at this time.  Visit Diagnosis:    ICD-10-CM   1. Bipolar I disorder, most recent episode depressed (HCC)  F31.30 QUEtiapine (SEROQUEL) 50 MG tablet    lamoTRIgine (LAMICTAL) 100 MG tablet    buPROPion (WELLBUTRIN XL) 300 MG 24 hr tablet    gabapentin (NEURONTIN) 300 MG capsule    Ambulatory referral to Social Work  2. Attention deficit hyperactivity disorder (ADHD), predominantly inattentive type  F90.0 atomoxetine (STRATTERA) 80 MG capsule    Ambulatory referral to Social Work  3. PTSD (post-traumatic stress disorder)  F43.10 clonazePAM (KLONOPIN) 0.5 MG tablet    Ambulatory referral to Social Work  4. Generalized anxiety disorder  F41.1 clonazePAM (KLONOPIN) 0.5 MG tablet    Ambulatory referral to Social Work    Past Psychiatric History: Bipolar disorder, PTSD, MDD, GAD  Past Medical History:  Past Medical History:  Diagnosis Date  . ADHD   . Anxiety   . PTSD (post-traumatic stress disorder)     Past Surgical History:  Procedure Laterality Date  . TUBAL LIGATION      Family Psychiatric History: unknown  Family  History:  Family History  Problem Relation Age of Onset  . ADD / ADHD Son     Social History:  Social History   Socioeconomic History  . Marital status: Married    Spouse name: Not on file  . Number of children: Not on file  . Years of education: Not on file  . Highest education level: Not on file  Occupational History  . Occupation: alterations  Tobacco Use  . Smoking status: Former Smoker    Years: 1.00    Types: Cigarettes  . Smokeless tobacco: Never Used  Vaping Use  . Vaping Use:  Never used  Substance and Sexual Activity  . Alcohol use: Not Currently  . Drug use: Yes    Types: Marijuana  . Sexual activity: Yes    Partners: Male    Birth control/protection: None  Other Topics Concern  . 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   Social Determinants of Health   Financial Resource Strain: Not on file  Food Insecurity: Not on file  Transportation Needs: Not on file  Physical Activity: Not on file  Stress: Not on file  Social Connections: Not on file    Allergies:  No Active Allergies  Metabolic Disorder Labs: No results found for: HGBA1C, MPG No results found for: PROLACTIN No results found for: CHOL, TRIG, HDL, CHOLHDL, VLDL, LDLCALC No results found for: TSH  Therapeutic Level Labs: No results found for: LITHIUM No results found for: VALPROATE No components found for:  CBMZ  Current Medications: Current Outpatient Medications  Medication Sig Dispense Refill  . atomoxetine (STRATTERA) 80 MG capsule Take 1 capsule (80 mg total) by mouth daily. 30 capsule 2  . buPROPion (WELLBUTRIN XL) 300 MG 24 hr tablet Take 1 tablet (300 mg total) by mouth daily. 30 tablet 2  . clonazePAM (KLONOPIN) 0.5 MG tablet Take 0.5 tablets (0.25 mg total) by mouth 3 (three) times daily as needed for anxiety. 45 tablet 1  . gabapentin (NEURONTIN) 300 MG capsule Take 1 capsule (300 mg total) by mouth 3 (three) times daily. 90 capsule 2  . lamoTRIgine (LAMICTAL) 100 MG tablet Take 1 tablet (100 mg total) by mouth daily. 30 tablet 2  . lidocaine (LIDODERM) 5 % Place 1 patch onto the skin daily. Remove & Discard patch within 12 hours or as directed by MD (Patient not taking: Reported on 05/14/2020) 14 patch 0  . loratadine (CLARITIN) 10 MG tablet Take 10 mg by mouth daily as needed for allergies. (Patient not taking: Reported on 05/14/2020)    . methocarbamol (ROBAXIN) 500 MG tablet Take 1 tablet (500 mg total)  by mouth 2 (two) times daily. (Patient not taking: Reported on 05/14/2020) 20 tablet 0  . QUEtiapine (SEROQUEL) 50 MG tablet Take 3 tablets (150 mg total) by mouth daily. 90 tablet 2   Current Facility-Administered Medications  Medication Dose Route Frequency Provider Last Rate Last Admin  . azithromycin (ZITHROMAX) tablet 1,000 mg  1,000 mg Oral Once Conan Bowens, MD         Musculoskeletal: Strength & Muscle Tone: Unable to assess due to telehealth visit Gait & Station: Unable to assess due to telehealth visit Patient leans: N/A  Psychiatric Specialty Exam:     General Appearance: Casual and Well Groomed  Eye Contact:  Good  Speech:  Clear and Coherent and Normal Rate  Volume:  Normal  Mood:  Anxious  and Depressed  Affect:  Appropriate and Congruent  Thought Process:  Coherent, Goal Directed and Linear  Orientation:  Full (Time, Place, and Person)  Thought Content: WDL and Logical   Suicidal Thoughts:  No  Homicidal Thoughts:  No  Memory:  Immediate;   Good Recent;   Good Remote;   Good  Judgement:  Fair  Insight:  Fair  Psychomotor Activity:  Normal  Concentration:  Concentration: Good and Attention Span: Good  Recall:  Good  Fund of Knowledge: Good  Language: Good  Akathisia:  NA  Handed:  Right  AIMS (if indicated): not done  Assets:  Communication Skills Desire for Improvement Housing Social Support  ADL's:  Intact  Cognition: WNL  Sleep:  Good   Screenings: AIMS   Flowsheet Row [facility]  AIMS Total Score 0    AUDIT   Flowsheet Row Ad[facility] ([facility]  Alcohol Use Disorder Identification Test Final Score (AUDIT) 0    GAD-7   Flowsheet Row Video Visit from 07/09/2020 in [facility] Video Visit from 06/10/2020 in [facility] Video Visit from 05/13/2020 in  [facility] Video Visit from 03/22/2020 in [facility]  Total GAD-7 Score 21 14 21 21     PHQ2-9   Flowsheet Row Video Visit from 07/09/2020 in [facility] Video Visit from 06/10/2020 in [facility] Video Visit from 05/13/2020 in [facility] Video Visit from 03/22/2020 in [facility] Office Visit from 08/25/2016 in Primary Care at [facility] Total Score 4 6 6 6  0  PHQ-9 Total Score 15 13 20 21  --    Flowsheet Row Video Visit from 07/09/2020 in [facility] Video Visit from 06/10/2020 in [facility] Admission (Discharged) [facility]  C-SSRS RISK CATEGORY No Risk No Risk Moderate Risk       Assessment and Plan: Patient endorses symptoms of anxiety, depression, and hypomania.  Today she is agreeable to increasing Seroquel 100 mg to 150 mg to help manage mood.  She is also agreeable to increase Lamictal 75 mg to 100 mg to help manage mood.  She will continue all other medication as prescribed   1. Bipolar I disorder, most recent episode depressed (HCC)  Increased- QUEtiapine (SEROQUEL) 50 MG tablet; Take 3 tablets (150 mg total) by mouth daily.  Dispense: 90 tablet; Refill: 2 Increased- lamoTRIgine (LAMICTAL) 100 MG tablet; Take 1 tablet (100 mg total) by mouth daily.  Dispense: 30 tablet; Refill: 2 Continue- buPROPion (WELLBUTRIN XL) 300 MG 24 hr tablet; Take 1 tablet (300 mg total) by mouth daily.  Dispense: 30 tablet; Refill: 2 Continue- gabapentin (NEURONTIN) 300 MG capsule; Take 1 capsule (300 mg total) by mouth 3 (three) times daily.  Dispense: 90 capsule; Refill: 2  2. Attention deficit hyperactivity disorder (ADHD), predominantly inattentive type  Continue- atomoxetine (STRATTERA) 80 MG capsule; Take 1 capsule  (80 mg total) by mouth daily.  Dispense: 30 capsule; Refill: 2  3. PTSD (post-traumatic stress disorder)  Continue- clonazePAM (KLONOPIN) 0.5 MG tablet; Take 0.5 tablets (0.25 mg total) by mouth 3 (three) times daily as needed for anxiety.  Dispense: 45 tablet; Refill: 1  4. Generalized anxiety disorder  Continue- clonazePAM (KLONOPIN) 0.5 MG tablet;  Take 0.5 tablets (0.25 mg total) by mouth 3 (three) times daily as needed for anxiety.  Dispense: 45 tablet; Refill: 1     Follow up in 3 months Follow up with threapy    Shanna Cisco, NP 07/09/2020, 2:13 PM

## 2020-07-17 ENCOUNTER — Encounter: Payer: Self-pay | Admitting: Gastroenterology

## 2020-07-18 ENCOUNTER — Encounter: Payer: Self-pay | Admitting: Gastroenterology

## 2020-07-23 ENCOUNTER — Telehealth (INDEPENDENT_AMBULATORY_CARE_PROVIDER_SITE_OTHER): Payer: No Payment, Other | Admitting: Psychiatry

## 2020-07-23 ENCOUNTER — Other Ambulatory Visit: Payer: Self-pay

## 2020-07-23 ENCOUNTER — Encounter (HOSPITAL_COMMUNITY): Payer: Self-pay | Admitting: Psychiatry

## 2020-07-23 DIAGNOSIS — F4310 Post-traumatic stress disorder, unspecified: Secondary | ICD-10-CM

## 2020-07-23 DIAGNOSIS — F900 Attention-deficit hyperactivity disorder, predominantly inattentive type: Secondary | ICD-10-CM | POA: Diagnosis not present

## 2020-07-23 DIAGNOSIS — F411 Generalized anxiety disorder: Secondary | ICD-10-CM

## 2020-07-23 DIAGNOSIS — F3130 Bipolar disorder, current episode depressed, mild or moderate severity, unspecified: Secondary | ICD-10-CM

## 2020-07-23 MED ORDER — BUPROPION HCL ER (XL) 150 MG PO TB24: 150.0000 mg | ORAL_TABLET | Freq: Every day | ORAL | 2 refills | Status: DC

## 2020-07-23 MED ORDER — BUPROPION HCL ER (XL) 300 MG PO TB24: 300.0000 mg | ORAL_TABLET | ORAL | 2 refills | Status: DC

## 2020-07-23 MED ORDER — CLONAZEPAM 0.5 MG PO TABS: 0.2500 mg | ORAL_TABLET | Freq: Three times a day (TID) | ORAL | 1 refills | Status: DC | PRN

## 2020-07-23 MED ORDER — GABAPENTIN 300 MG PO CAPS: 300.0000 mg | ORAL_CAPSULE | Freq: Three times a day (TID) | ORAL | 2 refills | Status: DC

## 2020-07-23 MED ORDER — QUETIAPINE FUMARATE 100 MG PO TABS: 100.0000 mg | ORAL_TABLET | Freq: Every day | ORAL | 2 refills | Status: DC

## 2020-07-23 MED ORDER — ATOMOXETINE HCL 80 MG PO CAPS: 80.0000 mg | ORAL_CAPSULE | Freq: Every day | ORAL | 2 refills | Status: DC

## 2020-07-23 MED ORDER — LAMOTRIGINE 100 MG PO TABS: 100.0000 mg | ORAL_TABLET | Freq: Every day | ORAL | 2 refills | Status: DC

## 2020-07-23 NOTE — Progress Notes (Signed)
BH MD/PA/NP OP Progress Note Virtual Visit via Video Note  I connected with Melissa Davies on 07/23/20 at  9:30 AM EDT by a video enabled telemedicine application and verified that I am speaking with the correct person using two identifiers.  Location: Patient: Home Provider: Clinic   I discussed the limitations of evaluation and management by telemedicine and the availability of in person appointments. The patient expressed understanding and agreed to proceed.  I provided 30 minutes of non-face-to-face time during this encounter.             07/23/2020 10:37 AM Melissa Davies  MRN:  [id number]  Chief Complaint:  "I have been falling and dizzy. "  HPI: 50 year old female seen today for follow up psychiatric evaluation. She has a psychiatric history of ADD, depression, PTSD, Bipolar 1, and anxiety. She is currently being managed on Wellbutrin XL 300 daily, Klonopin 0.25 mg 3 times daily,  gabapentin 300 mg 3 times daily,starttera 80 mg, Lamictal 100 mg daily and Seroquel 100 mg daily (takes in the daytime because she works at night).  She notes her medications are somewhat effective in managing her psychiatric conditions  Today patient pleasant, cooperative, engaged in conversation, tearful, and maintained eye contact.  She informed since increasing her medication she has been more dizzy.  She notes that she almost fell while at a store and bruised her arm.  Provider informed patient that Lamictal and Seroquel together may be contributing to her dizziness and recommended reducing or discontinuing one of the medications.  She notes that she did not want to discontinue either.  Patient notes that she continues to be depressed and anxious most days.  Today provider conducted a PHQ-9 and patient scored a 16, at her last visit she scored a 15.  Provider also conducted a GAD-7 and patient scored a 21, at her last visit she scored a 21.  Today she denies SI/HI/VAH or paranoia.   Patient notes that her appetite and sleep are adequate.    Today she is agreeable to reduce the Seroquel 150 mg to 100 mg to help manage mood and reduce dizziness.  She is also agreeable to increasing Wellbutrin 300mg  to 450 mg to help manage depression.  She will continue all other medications as prescribed. No other concerns noted at this time.  Visit Diagnosis:    ICD-10-CM   1. Bipolar I disorder, most recent episode depressed (HCC)  F31.30 gabapentin (NEURONTIN) 300 MG capsule    lamoTRIgine (LAMICTAL) 100 MG tablet    QUEtiapine (SEROQUEL) 100 MG tablet    buPROPion (WELLBUTRIN XL) 150 MG 24 hr tablet    buPROPion (WELLBUTRIN XL) 300 MG 24 hr tablet  2. PTSD (post-traumatic stress disorder)  F43.10 clonazePAM (KLONOPIN) 0.5 MG tablet  3. Generalized anxiety disorder  F41.1 clonazePAM (KLONOPIN) 0.5 MG tablet  4. Attention deficit hyperactivity disorder (ADHD), predominantly inattentive type  F90.0 atomoxetine (STRATTERA) 80 MG capsule    buPROPion (WELLBUTRIN XL) 150 MG 24 hr tablet    buPROPion (WELLBUTRIN XL) 300 MG 24 hr tablet    Past Psychiatric History: Bipolar disorder, PTSD, MDD, GAD  Past Medical History:  Past Medical History:  Diagnosis Date  . ADHD   . Anxiety   . PTSD (post-traumatic stress disorder)     Past Surgical History:  Procedure Laterality Date  . TUBAL LIGATION      Family Psychiatric History: unknown  Family History:  Family History  Problem Relation Age of Onset  .  ADD / ADHD Son     Social History:  Social History   Socioeconomic History  . Marital status: Married    Spouse name: Not on file  . Number of children: Not on file  . Years of education: Not on file  . Highest education level: Not on file  Occupational History  . Occupation: alterations  Tobacco Use  . Smoking status: Former Smoker    Years: 1.00    Types: Cigarettes  . Smokeless tobacco: Never Used  Vaping Use  . Vaping Use: Never used  Substance and Sexual Activity   . Alcohol use: Not Currently  . Drug use: Yes    Types: Marijuana  . Sexual activity: Yes    Partners: Male    Birth control/protection: None  Other Topics Concern  . 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   Social Determinants of Health   Financial Resource Strain: Not on file  Food Insecurity: Not on file  Transportation Needs: Not on file  Physical Activity: Not on file  Stress: Not on file  Social Connections: Not on file    Allergies:  No Active Allergies  Metabolic Disorder Labs: No results found for: HGBA1C, MPG No results found for: PROLACTIN No results found for: CHOL, TRIG, HDL, CHOLHDL, VLDL, LDLCALC No results found for: TSH  Therapeutic Level Labs: No results found for: LITHIUM No results found for: VALPROATE No components found for:  CBMZ  Current Medications: Current Outpatient Medications  Medication Sig Dispense Refill  . atomoxetine (STRATTERA) 80 MG capsule Take 1 capsule (80 mg total) by mouth daily. 30 capsule 2  . buPROPion (WELLBUTRIN XL) 150 MG 24 hr tablet Take 1 tablet (150 mg total) by mouth daily. 30 tablet 2  . buPROPion (WELLBUTRIN XL) 300 MG 24 hr tablet Take 1 tablet (300 mg total) by mouth every morning. 30 tablet 2  . clonazePAM (KLONOPIN) 0.5 MG tablet Take 0.5 tablets (0.25 mg total) by mouth 3 (three) times daily as needed for anxiety. 45 tablet 1  . gabapentin (NEURONTIN) 300 MG capsule Take 1 capsule (300 mg total) by mouth 3 (three) times daily. 90 capsule 2  . lamoTRIgine (LAMICTAL) 100 MG tablet Take 1 tablet (100 mg total) by mouth daily. 30 tablet 2  . lidocaine (LIDODERM) 5 % Place 1 patch onto the skin daily. Remove & Discard patch within 12 hours or as directed by MD (Patient not taking: Reported on 05/14/2020) 14 patch 0  . loratadine (CLARITIN) 10 MG tablet Take 10 mg by mouth daily as needed for allergies. (Patient not taking: Reported on 05/14/2020)     . methocarbamol (ROBAXIN) 500 MG tablet Take 1 tablet (500 mg total) by mouth 2 (two) times daily. (Patient not taking: Reported on 05/14/2020) 20 tablet 0  . QUEtiapine (SEROQUEL) 100 MG tablet Take 1 tablet (100 mg total) by mouth at bedtime. 30 tablet 2   Current Facility-Administered Medications  Medication Dose Route Frequency Provider Last Rate Last Admin  . azithromycin (ZITHROMAX) tablet 1,000 mg  1,000 mg Oral Once Conan Bowens, MD         Musculoskeletal: Strength & Muscle Tone: Unable to assess due to telehealth visit Gait & Station: Unable to assess due to telehealth visit Patient leans: N/A  Psychiatric Specialty Exam:     General Appearance: Casual and Well Groomed  Eye Contact:  Good  Speech:  Clear and  Coherent and Normal Rate  Volume:  Normal  Mood:  Anxious and Depressed  Affect:  Appropriate and Congruent  Thought Process:  Coherent, Goal Directed and Linear  Orientation:  Full (Time, Place, and Person)  Thought Content: WDL and Logical   Suicidal Thoughts:  No  Homicidal Thoughts:  No  Memory:  Immediate;   Good Recent;   Good Remote;   Good  Judgement:  Fair  Insight:  Fair  Psychomotor Activity:  Normal  Concentration:  Concentration: Good and Attention Span: Good  Recall:  Good  Fund of Knowledge: Good  Language: Good  Akathisia:  NA  Handed:  Right  AIMS (if indicated): not done  Assets:  Communication Skills Desire for Improvement Housing Social Support  ADL's:  Intact  Cognition: WNL  Sleep:  Good   Screenings: AIMS   Flowsheet Row [facility]  AIMS Total Score 0    AUDIT   Flowsheet Row [facility]  Alcohol Use Disorder Identification Test Final Score (AUDIT) 0    GAD-7   Flowsheet Row [facility]  Total GAD-7 Score 21 14 21 21     PHQ2-9   Flowsheet Row [facility]  PHQ-2 Total Score 3 4 6 6 6   PHQ-9 Total Score 16 15 13 20 21     Flowsheet Row [facility]  C-SSRS RISK CATEGORY Low Risk No Risk No Risk       Assessment and Plan: Patient endorses symptoms of anxiety, depression.she notes that since increasing her Seroquel and Lamictal she has been more dizzy.  Today she is agreeable to reducing Seroquel 150mg  to 100 mg to help manage mood and reduce sedation.  She will continue all other medications as prescribed.    1. Bipolar I disorder, most recent episode depressed (HCC)  Decreased- QUEtiapine (SEROQUEL) 100 MG tablet; Take 1 tablet (100 mg total) by mouth daily.  Dispense: 90 tablet; Refill: 2 Increased- lamoTRIgine (LAMICTAL) 100 MG tablet; Take 1 tablet (100 mg total) by mouth daily.  Dispense: 30 tablet; Refill: 2 Continue- buPROPion (WELLBUTRIN XL) 300 MG 24 hr tablet; Take 1 tablet (300 mg total) by mouth daily.  Dispense: 30 tablet; Refill: 2 Continue- gabapentin (NEURONTIN) 300 MG capsule; Take 1 capsule (300 mg total) by mouth 3 (three) times daily.  Dispense: 90 capsule; Refill: 2 Start- buPROPion (WELLBUTRIN XL) 150 MG 24 hr tablet; Take 1 tablet (150 mg  total) by mouth daily.  Dispense: 30 tablet; Refill: 2  2. Attention deficit hyperactivity disorder (ADHD), predominantly inattentive type  Continue- atomoxetine (STRATTERA) 80 MG capsule; Take 1 capsule (80 mg total) by mouth daily.  Dispense: 30 capsule; Refill: 2 Start- buPROPion (WELLBUTRIN XL) 150 MG 24 hr tablet; Take 1 tablet (150 mg  total) by mouth daily.  Dispense: 30 tablet; Refill: 2  3. PTSD (post-traumatic stress disorder)  Continue- clonazePAM (KLONOPIN) 0.5 MG tablet; Take 0.5 tablets (0.25 mg total) by mouth 3 (three) times daily as needed for anxiety.  Dispense: 45 tablet; Refill: 1  4. Generalized anxiety disorder  Continue- clonazePAM (KLONOPIN) 0.5 MG tablet; Take 0.5 tablets (0.25 mg total) by mouth 3 (three) times daily as needed for anxiety.  Dispense: 45 tablet; Refill: 1      Follow up in 2 months Follow up with threapy    Shanna Cisco, NP 07/23/2020, 10:37 AM

## 2020-07-25 ENCOUNTER — Telehealth (HOSPITAL_COMMUNITY): Payer: Self-pay

## 2020-07-25 ENCOUNTER — Encounter (HOSPITAL_COMMUNITY): Payer: Self-pay | Admitting: Psychiatry

## 2020-07-25 NOTE — Telephone Encounter (Signed)
Provider called patient and was informed by patient that she continues to have increased dizziness while on Seroquel.  Provider informed patient that the dizziness potentially could be related to being on Lamictal and Seroquel at the same time.  Patient notes that she likes both medication.  Provider recommended that patient discontinue Seroquel at this time and follow-up with primary care doctor.  She endorsed understanding and agreed.  Patient notes that she missed work today and request that provider write her a note notifying her employers of possible medication reaction.  Provider was agreeable to this request.  No other concerns noted at this time.

## 2020-07-25 NOTE — Telephone Encounter (Signed)
Patient called and stated that she woke up & the whole room was spinning. Therefore, she couldn't go to work today. She also stated that every time she lifts her head, the spinning continues and she has to hold on to something to brace herself. She stated that it's bad & hasn't been able to get out of bed. She stated that she hasn't picked her medication yet to see if the decreased dosage on her Seroquel will help. She's having her friend take her to pick up her medications today. She stated that you did a note for her for 4/12 but she needs one for today as well. Please review and advise. Thank you

## 2020-08-07 ENCOUNTER — Inpatient Hospital Stay: Admission: RE | Admit: 2020-08-07 | Payer: Self-pay | Source: Ambulatory Visit

## 2020-08-22 ENCOUNTER — Other Ambulatory Visit: Payer: Self-pay

## 2020-08-22 ENCOUNTER — Telehealth (HOSPITAL_COMMUNITY): Payer: Self-pay | Admitting: Licensed Clinical Social Worker

## 2020-08-22 ENCOUNTER — Ambulatory Visit (HOSPITAL_COMMUNITY): Payer: No Payment, Other | Admitting: Licensed Clinical Social Worker

## 2020-08-22 NOTE — Telephone Encounter (Signed)
LCSW sent text with link for video session per schedule. LCSW remained online until 2:10 but pt failed to sign in for session.

## 2020-08-29 ENCOUNTER — Telehealth (HOSPITAL_COMMUNITY): Payer: Self-pay | Admitting: *Deleted

## 2020-08-29 NOTE — Telephone Encounter (Signed)
Call from patient stating she would like to talk with Dr Doyne Keel because her "medicine is not doing what is should be doing."  Will forward the message to the provider.

## 2020-08-30 ENCOUNTER — Other Ambulatory Visit (HOSPITAL_COMMUNITY): Payer: Self-pay | Admitting: Psychiatry

## 2020-08-30 DIAGNOSIS — F3130 Bipolar disorder, current episode depressed, mild or moderate severity, unspecified: Secondary | ICD-10-CM

## 2020-08-30 MED ORDER — LAMOTRIGINE 150 MG PO TABS: 150.0000 mg | ORAL_TABLET | Freq: Every day | ORAL | 2 refills | Status: DC

## 2020-08-30 NOTE — Telephone Encounter (Signed)
Provider spoke to patient who notes that her endorses symptoms of hypomania such as fluctuations in mood, irritability, racing thoughts, and distractibility. She informed Clinical research associate that she snapped on her neighbors when she felt threatened by her neighbors dog. She notes that she called the police however she notes the police sided with her neighbor. Today she denies SI/HI/VAH or paranoia. She is agreeable to increase Lamictal to 150 mg to help stabilize mood. Seroquel not increased at this time because patient became dizzy with increased dose. Providers discussed switching to Zyprexa if increase in Lamictal does not stabilize mood as Zyprexa has minimal drug to drug interaction with Lamictal. She endorses adequate sleep and appetite. No other concerns noted at this time.

## 2020-09-19 ENCOUNTER — Encounter: Payer: Self-pay | Admitting: Gastroenterology

## 2020-09-24 ENCOUNTER — Telehealth (HOSPITAL_COMMUNITY): Payer: Self-pay | Admitting: Psychiatry

## 2020-09-24 NOTE — Telephone Encounter (Signed)
Thank you for this update 

## 2020-09-24 NOTE — Telephone Encounter (Signed)
Patient contacted office to cancel appt. Patient stated she received bills for not attending appointments and services. Patient feels that she is not benefiting from our services and medications that she is prescribed .Writer informed patient that appointments have been cancelled, she may contacting billing for any billing questions and we are available for services if she feels she needs to return. Patient was understanding and appreciative.

## 2020-09-25 ENCOUNTER — Telehealth (HOSPITAL_COMMUNITY): Payer: No Payment, Other | Admitting: Psychiatry

## 2020-10-02 ENCOUNTER — Telehealth (HOSPITAL_COMMUNITY): Payer: 59 | Admitting: Psychiatry

## 2020-11-27 ENCOUNTER — Other Ambulatory Visit: Payer: Self-pay

## 2020-11-27 ENCOUNTER — Ambulatory Visit
Admission: EM | Admit: 2020-11-27 | Discharge: 2020-11-27 | Disposition: A | Discharge status: Home / Self Care | Payer: 59 | Attending: Internal Medicine | Admitting: Internal Medicine

## 2020-11-27 ENCOUNTER — Encounter: Payer: Self-pay | Admitting: Emergency Medicine

## 2020-11-27 DIAGNOSIS — R300 Dysuria: Secondary | ICD-10-CM | POA: Diagnosis present

## 2020-11-27 DIAGNOSIS — N3000 Acute cystitis without hematuria: Secondary | ICD-10-CM | POA: Insufficient documentation

## 2020-11-27 DIAGNOSIS — N898 Other specified noninflammatory disorders of vagina: Secondary | ICD-10-CM | POA: Diagnosis not present

## 2020-11-27 LAB — POCT URINALYSIS DIP (MANUAL ENTRY)
Bilirubin, UA: NEGATIVE
Blood, UA: NEGATIVE
Glucose, UA: NEGATIVE mg/dL
Ketones, POC UA: NEGATIVE mg/dL
Nitrite, UA: NEGATIVE
Protein Ur, POC: NEGATIVE mg/dL
Spec Grav, UA: 1.015 (ref 1.010–1.025)
Urobilinogen, UA: 0.2 E.U./dL
pH, UA: 7 (ref 5.0–8.0)

## 2020-11-27 MED ORDER — NITROFURANTOIN MONOHYD MACRO 100 MG PO CAPS: 100.0000 mg | ORAL_CAPSULE | Freq: Two times a day (BID) | ORAL | 0 refills | Status: DC

## 2020-11-27 NOTE — ED Provider Notes (Signed)
[facility]    CSN: [id number] Arrival date & time: 11/27/20  1700      History   Chief Complaint Chief Complaint  Patient presents with   Vaginal Discharge    HPI Melissa Davies is a 50 y.o. female.   Presents with green vaginal discharge for 3 days.  Denies any urinary burning, frequency, pelvic pain, abdominal pain, back pain, hematuria, fever.  Denies any known exposure to STD but has had unprotected sexual intercourse recently.  Last menstrual cycle was approximately 2 years ago per patient.   Vaginal Discharge  Past Medical History:  Diagnosis Date   ADHD    Anxiety    PTSD (post-traumatic stress disorder)     Patient Active Problem List   Diagnosis Date Noted   Bipolar I disorder, most recent episode depressed (HCC) 10/25/2019   MDD (major depressive disorder), recurrent episode, severe (HCC) 07/22/2019   Major depressive disorder, recurrent episode (HCC) 07/22/2019   Suicidal ideation 07/21/2019   PTSD (post-traumatic stress disorder) 09/13/2013   Depression, reactive 05/24/2013   ADD (attention deficit disorder) 09/26/2012   Generalized anxiety disorder 09/26/2012   BMI 36.0-36.9,adult 09/26/2012    Past Surgical History:  Procedure Laterality Date   TUBAL LIGATION      OB History     Gravida  3   Para  3   Term  3   Preterm      AB      Living  3      SAB      IAB      Ectopic      Multiple      Live Births  3            Home Medications    Prior to Admission medications   Medication Sig Start Date End Date Taking? Authorizing Provider  nitrofurantoin, macrocrystal-monohydrate, (MACROBID) 100 MG capsule Take 1 capsule (100 mg total) by mouth 2 (two) times daily. 11/27/20  Yes Lance Muss, FNP  atomoxetine (STRATTERA) 80 MG capsule Take 1 capsule (80 mg total) by mouth daily. 07/23/20   Shanna Cisco, NP  buPROPion (WELLBUTRIN XL) 150 MG 24 hr tablet Take 1 tablet (150 mg total) by mouth daily.  07/23/20   Shanna Cisco, NP  buPROPion (WELLBUTRIN XL) 300 MG 24 hr tablet Take 1 tablet (300 mg total) by mouth every morning. 07/23/20   Shanna Cisco, NP  clonazePAM (KLONOPIN) 0.5 MG tablet Take 0.5 tablets (0.25 mg total) by mouth 3 (three) times daily as needed for anxiety. 07/23/20   Shanna Cisco, NP  gabapentin (NEURONTIN) 300 MG capsule Take 1 capsule (300 mg total) by mouth 3 (three) times daily. 07/23/20   Shanna Cisco, NP  lamoTRIgine (LAMICTAL) 150 MG tablet Take 1 tablet (150 mg total) by mouth daily. 08/30/20   Toy Cookey E, NP  lidocaine (LIDODERM) 5 % Place 1 patch onto the skin daily. Remove & Discard patch within 12 hours or as directed by MD Patient not taking: Reported on 05/14/2020 10/31/19   McDonald, Mia A, PA-C  loratadine (CLARITIN) 10 MG tablet Take 10 mg by mouth daily as needed for allergies. Patient not taking: Reported on 05/14/2020    [provider]  methocarbamol (ROBAXIN) 500 MG tablet Take 1 tablet (500 mg total) by mouth 2 (two) times daily. Patient not taking: Reported on 05/14/2020 10/31/19   McDonald, Mia A, PA-C  QUEtiapine (SEROQUEL) 100 MG tablet Take 1 tablet (  100 mg total) by mouth at bedtime. 07/23/20   Shanna Cisco, NP  FLUoxetine (PROZAC) 20 MG capsule Take 1 capsule (20 mg total) by mouth daily. Patient not taking: Reported on 10/31/2019 07/25/19 10/31/19  Aldean Baker, NP    Family History Family History  Problem Relation Age of Onset   ADD / ADHD Son     Social History Social History   Tobacco Use   Smoking status: Former    Years: 1.00    Types: Cigarettes   Smokeless tobacco: Never  Vaping Use   Vaping Use: Never used  Substance Use Topics   Alcohol use: Not Currently   Drug use: Yes    Types: Marijuana     Allergies   Patient has no known allergies.   Review of Systems Review of Systems Per HPI  Physical Exam Triage Vital Signs ED Triage Vitals [11/27/20 1719]  Enc Vitals Group      BP 120/71     Pulse Rate 85     Resp 18     Temp 98 F (36.7 C)     Temp Source Oral     SpO2 98 %     Weight      Height      Head Circumference      Peak Flow      Pain Score 0     Pain Loc      Pain Edu?      Excl. in GC?    No data found.  Updated Vital Signs BP 120/71 (BP Location: Left Arm)   Pulse 85   Temp 98 F (36.7 C) (Oral)   Resp 18   LMP 05/11/2016 (Approximate)   SpO2 98%   Visual Acuity Right Eye Distance:   Left Eye Distance:   Bilateral Distance:    Right Eye Near:   Left Eye Near:    Bilateral Near:     Physical Exam Constitutional:      Appearance: Normal appearance.  HENT:     Head: Normocephalic and atraumatic.  Eyes:     Extraocular Movements: Extraocular movements intact.     Conjunctiva/sclera: Conjunctivae normal.  Cardiovascular:     Rate and Rhythm: Normal rate and regular rhythm.     Pulses: Normal pulses.     Heart sounds: Normal heart sounds.  Pulmonary:     Effort: Pulmonary effort is normal.  Abdominal:     General: Bowel sounds are normal. There is no distension.     Palpations: Abdomen is soft.     Tenderness: There is no abdominal tenderness.  Skin:    General: Skin is warm and dry.  Neurological:     General: No focal deficit present.     Mental Status: She is alert and oriented to person, place, and time. Mental status is at baseline.  Psychiatric:        Mood and Affect: Mood normal.        Behavior: Behavior normal.        Thought Content: Thought content normal.        Judgment: Judgment normal.     UC Treatments / Results  Labs (all labs ordered are listed, but only abnormal results are displayed) Labs Reviewed  POCT URINALYSIS DIP (MANUAL ENTRY) - Abnormal; Notable for the following components:      Result Value   Leukocytes, UA Trace (*)    All other components within normal limits  URINE CULTURE  CERVICOVAGINAL ANCILLARY ONLY  EKG   Radiology No results  found.  Procedures Procedures (including critical care time)  Medications Ordered in UC Medications - No data to display  Initial Impression / Assessment and Plan / UC Course  I have reviewed the triage vital signs and the nursing notes.  Pertinent labs & imaging results that were available during my care of the patient were reviewed by me and considered in my medical decision making (see chart for details).     Urinalysis showing trace leukocytes.  Will treat with Macrobid antibiotic x7 days.  Higher suspicion that STD or bacterial vaginosis could be causing trace leukocytes on urinalysis due to main symptom being vaginal discharge.  Will await STD vaginal swab and urine culture and change treatment if appropriate.  Advised patient to refrain from any sexual activity until test results are complete.Discussed strict return precautions. Patient verbalized understanding and is agreeable with plan.  Final Clinical Impressions(s) / UC Diagnoses   Final diagnoses:  Dysuria  Vaginal discharge  Acute cystitis without hematuria     Discharge Instructions      Your urine showed mild signs of urinary tract infection.  Urine culture and STD vaginal swab are pending.  You have been prescribed nitrofurantoin antibiotic to treat possible urinary tract infection.  We will call if test results are positive and change treatment as appropriate.     ED Prescriptions     Medication Sig Dispense Auth. Provider   nitrofurantoin, macrocrystal-monohydrate, (MACROBID) 100 MG capsule Take 1 capsule (100 mg total) by mouth 2 (two) times daily. 10 capsule Lance Muss, FNP      PDMP not reviewed this encounter.   Lance Muss, FNP 11/27/20 1801

## 2020-11-27 NOTE — Discharge Instructions (Addendum)
Your urine showed mild signs of urinary tract infection.  Urine culture and STD vaginal swab are pending.  You have been prescribed nitrofurantoin antibiotic to treat possible urinary tract infection.  We will call if test results are positive and change treatment as appropriate.

## 2020-11-27 NOTE — ED Triage Notes (Signed)
Pt here for green vaginal discharge x 3 days; denies dysuria

## 2020-11-28 LAB — CERVICOVAGINAL ANCILLARY ONLY
Bacterial Vaginitis (gardnerella): NEGATIVE
Candida Glabrata: NEGATIVE
Candida Vaginitis: NEGATIVE
Chlamydia: NEGATIVE
Comment: NEGATIVE
Comment: NEGATIVE
Comment: NEGATIVE
Comment: NEGATIVE
Comment: NEGATIVE
Comment: NORMAL
Neisseria Gonorrhea: NEGATIVE
Trichomonas: NEGATIVE

## 2020-11-29 LAB — URINE CULTURE: Culture: <10000 — AB

## 2020-12-05 ENCOUNTER — Other Ambulatory Visit: Payer: Self-pay

## 2020-12-05 ENCOUNTER — Ambulatory Visit
Admission: EM | Admit: 2020-12-05 | Discharge: 2020-12-05 | Disposition: A | Discharge status: Home / Self Care | Payer: 59 | Attending: Family Medicine | Admitting: Family Medicine

## 2020-12-05 DIAGNOSIS — N898 Other specified noninflammatory disorders of vagina: Secondary | ICD-10-CM | POA: Insufficient documentation

## 2020-12-05 LAB — POCT URINALYSIS DIP (MANUAL ENTRY)
Bilirubin, UA: NEGATIVE
Glucose, UA: NEGATIVE mg/dL
Ketones, POC UA: NEGATIVE mg/dL
Leukocytes, UA: NEGATIVE
Nitrite, UA: NEGATIVE
Protein Ur, POC: 30 mg/dL — AB
Spec Grav, UA: 1.02 (ref 1.010–1.025)
Urobilinogen, UA: 0.2 E.U./dL
pH, UA: 7 (ref 5.0–8.0)

## 2020-12-05 NOTE — ED Provider Notes (Signed)
[facility]    CSN: [id number] Arrival date & time: 12/05/20  0913      History   Chief Complaint Chief Complaint  Patient presents with   Vaginal Discharge    HPI Alondria Mousseau Whidden is a 50 y.o. female.   Patient presenting today with 10 days of green vaginal discharge.  She denies vaginal itching, odor, pain or irritation, nausea, vomiting, fever, rashes, flank pain.  Not trying anything over-the-counter for symptoms.  Recent unprotected intercourse with a partner who historically gave her gonorrhea and chlamydia a while back.  She was seen about a week and a half ago with negative STD screening.    Past Medical History:  Diagnosis Date   ADHD    Anxiety    PTSD (post-traumatic stress disorder)     Patient Active Problem List   Diagnosis Date Noted   Bipolar I disorder, most recent episode depressed (HCC) 10/25/2019   MDD (major depressive disorder), recurrent episode, severe (HCC) 07/22/2019   Major depressive disorder, recurrent episode (HCC) 07/22/2019   Suicidal ideation 07/21/2019   PTSD (post-traumatic stress disorder) 09/13/2013   Depression, reactive 05/24/2013   ADD (attention deficit disorder) 09/26/2012   Generalized anxiety disorder 09/26/2012   BMI 36.0-36.9,adult 09/26/2012    Past Surgical History:  Procedure Laterality Date   TUBAL LIGATION      OB History     Gravida  3   Para  3   Term  3   Preterm      AB      Living  3      SAB      IAB      Ectopic      Multiple      Live Births  3            Home Medications    Prior to Admission medications   Medication Sig Start Date End Date Taking? Authorizing Provider  buPROPion (WELLBUTRIN XL) 150 MG 24 hr tablet Take 1 tablet (150 mg total) by mouth daily. 07/23/20   Shanna Cisco, NP  nitrofurantoin, macrocrystal-monohydrate, (MACROBID) 100 MG capsule Take 1 capsule (100 mg total) by mouth 2 (two) times daily. 11/27/20   Lance Muss, FNP   FLUoxetine (PROZAC) 20 MG capsule Take 1 capsule (20 mg total) by mouth daily. Patient not taking: Reported on 10/31/2019 07/25/19 10/31/19  Aldean Baker, NP    Family History Family History  Problem Relation Age of Onset   ADD / ADHD Son     Social History Social History   Tobacco Use   Smoking status: Former    Years: 1.00    Types: Cigarettes   Smokeless tobacco: Never  Vaping Use   Vaping Use: Never used  Substance Use Topics   Alcohol use: Not Currently   Drug use: Yes    Types: Marijuana     Allergies   Patient has no known allergies.   Review of Systems Review of Systems PER HPI   Physical Exam Triage Vital Signs ED Triage Vitals  Enc Vitals Group     BP 12/05/20 0924 136/84     Pulse Rate 12/05/20 0924 69     Resp 12/05/20 0924 18     Temp 12/05/20 0924 98 F (36.7 C)     Temp Source 12/05/20 0924 Oral     SpO2 12/05/20 0924 98 %     Weight --      Height --  Head Circumference --      Peak Flow --      Pain Score 12/05/20 0925 0     Pain Loc --      Pain Edu? --      Excl. in GC? --    No data found.  Updated Vital Signs BP 136/84 (BP Location: Left Arm)   Pulse 69   Temp 98 F (36.7 C) (Oral)   Resp 18   LMP 05/11/2016 (Approximate)   SpO2 98%   Visual Acuity Right Eye Distance:   Left Eye Distance:   Bilateral Distance:    Right Eye Near:   Left Eye Near:    Bilateral Near:     Physical Exam Vitals and nursing note reviewed.  Constitutional:      Appearance: Normal appearance. She is not ill-appearing.  HENT:     Head: Atraumatic.     Mouth/Throat:     Mouth: Mucous membranes are moist.  Eyes:     Extraocular Movements: Extraocular movements intact.     Conjunctiva/sclera: Conjunctivae normal.  Cardiovascular:     Rate and Rhythm: Normal rate and regular rhythm.     Heart sounds: Normal heart sounds.  Pulmonary:     Effort: Pulmonary effort is normal.     Breath sounds: Normal breath sounds.  Abdominal:      General: Bowel sounds are normal. There is no distension.     Palpations: Abdomen is soft.     Tenderness: There is no abdominal tenderness. There is no right CVA tenderness, left CVA tenderness or guarding.  Genitourinary:    Comments: GU exam deferred, self swab performed Musculoskeletal:        General: Normal range of motion.     Cervical back: Normal range of motion and neck supple.  Skin:    General: Skin is warm and dry.  Neurological:     Mental Status: She is alert and oriented to person, place, and time.  Psychiatric:        Mood and Affect: Mood normal.        Thought Content: Thought content normal.        Judgment: Judgment normal.    UC Treatments / Results  Labs (all labs ordered are listed, but only abnormal results are displayed) Labs Reviewed  POCT URINALYSIS DIP (MANUAL ENTRY) - Abnormal; Notable for the following components:      Result Value   Blood, UA trace-intact (*)    Protein Ur, POC =30 (*)    All other components within normal limits  CERVICOVAGINAL ANCILLARY ONLY    EKG   Radiology No results found.  Procedures Procedures (including critical care time)  Medications Ordered in UC Medications - No data to display  Initial Impression / Assessment and Plan / UC Course  I have reviewed the triage vital signs and the nursing notes.  Pertinent labs & imaging results that were available during my care of the patient were reviewed by me and considered in my medical decision making (see chart for details).     Vital signs, exam reassuring, UA without evidence of urinary tract infection, vaginal swab pending.  Discussed with over-the-counter remedies, good vaginal hygiene and safe sexual practices.  We will treat based on results.  Follow-up for worsening symptoms.  Final Clinical Impressions(s) / UC Diagnoses   Final diagnoses:  Vaginal discharge     Discharge Instructions      May try boric acid suppositories available over the counter  up to twice daily as needed. Make sure to only use unscented soaps in that area and no feminine wipes or washes     ED Prescriptions   None    PDMP not reviewed this encounter.   Particia Nearing, [place] 12/05/20 1043

## 2020-12-05 NOTE — Discharge Instructions (Addendum)
May try boric acid suppositories available over the counter up to twice daily as needed. Make sure to only use unscented soaps in that area and no feminine wipes or washes

## 2020-12-05 NOTE — ED Triage Notes (Signed)
Pt c/o green vaginal discharge x10 days. Denies urinary sx's.

## 2020-12-06 LAB — CERVICOVAGINAL ANCILLARY ONLY
Bacterial Vaginitis (gardnerella): NEGATIVE
Candida Glabrata: NEGATIVE
Candida Vaginitis: NEGATIVE
Chlamydia: NEGATIVE
Comment: NEGATIVE
Comment: NEGATIVE
Comment: NEGATIVE
Comment: NEGATIVE
Comment: NEGATIVE
Comment: NORMAL
Neisseria Gonorrhea: NEGATIVE
Trichomonas: NEGATIVE

## 2021-02-25 ENCOUNTER — Encounter (HOSPITAL_COMMUNITY): Payer: Self-pay

## 2021-02-25 ENCOUNTER — Other Ambulatory Visit: Payer: Self-pay

## 2021-02-25 ENCOUNTER — Emergency Department (HOSPITAL_COMMUNITY): Payer: 59

## 2021-02-25 ENCOUNTER — Emergency Department (HOSPITAL_COMMUNITY)
Admission: EM | Admit: 2021-02-25 | Discharge: 2021-02-25 | Disposition: A | Discharge status: Home / Self Care | Payer: 59 | Attending: Emergency Medicine | Admitting: Emergency Medicine

## 2021-02-25 DIAGNOSIS — G5602 Carpal tunnel syndrome, left upper limb: Secondary | ICD-10-CM | POA: Diagnosis present

## 2021-02-25 DIAGNOSIS — Z5321 Procedure and treatment not carried out due to patient leaving prior to being seen by health care provider: Secondary | ICD-10-CM | POA: Diagnosis not present

## 2021-02-25 MED ORDER — OXYCODONE-ACETAMINOPHEN 5-325 MG PO TABS
1.0000 | ORAL_TABLET | ORAL | Status: DC | PRN
  Administered 2021-02-25: 1 via ORAL
  Filled 2021-02-25: qty 1

## 2021-02-25 NOTE — ED Notes (Signed)
Pt is cursing in the lobby at registration staff. Pt was asked not to move the chairs around and became frustrated.

## 2021-02-25 NOTE — ED Notes (Signed)
Pt states that she's had carpel tunnel for years and has been able to use goodys and ibuprofen for the pain, but tonight her left hand started to lock up and the pain goes from her fingers to her elbow

## 2021-02-25 NOTE — ED Triage Notes (Signed)
Pt reports with carpal tunnel. Unknown side, pt is uncooperative.

## 2021-02-25 NOTE — ED Notes (Addendum)
Pt was being uncooperative in triage. When asked questions she became loud and rude, pt told me to just do my job. Pt states that she is in pain so she has a reason to act this way.

## 2021-02-25 NOTE — ED Notes (Signed)
Per registration patient using fowl language and left.

## 2021-03-27 ENCOUNTER — Ambulatory Visit
Admission: EM | Admit: 2021-03-27 | Discharge: 2021-03-27 | Disposition: A | Discharge status: Home / Self Care | Payer: 59 | Attending: Physician Assistant | Admitting: Physician Assistant

## 2021-03-27 ENCOUNTER — Other Ambulatory Visit: Payer: Self-pay

## 2021-03-27 DIAGNOSIS — Z113 Encounter for screening for infections with a predominantly sexual mode of transmission: Secondary | ICD-10-CM | POA: Insufficient documentation

## 2021-03-27 DIAGNOSIS — N898 Other specified noninflammatory disorders of vagina: Secondary | ICD-10-CM | POA: Insufficient documentation

## 2021-03-27 LAB — POCT URINALYSIS DIP (MANUAL ENTRY)
Bilirubin, UA: NEGATIVE
Blood, UA: NEGATIVE
Glucose, UA: NEGATIVE mg/dL
Ketones, POC UA: NEGATIVE mg/dL
Leukocytes, UA: NEGATIVE
Nitrite, UA: NEGATIVE
Protein Ur, POC: NEGATIVE mg/dL
Spec Grav, UA: 1.01 (ref 1.010–1.025)
Urobilinogen, UA: 0.2 E.U./dL
pH, UA: 6 (ref 5.0–8.0)

## 2021-03-27 MED ORDER — METRONIDAZOLE 500 MG PO TABS: 500.0000 mg | ORAL_TABLET | Freq: Two times a day (BID) | ORAL | 0 refills | Status: DC

## 2021-03-27 NOTE — ED Provider Notes (Signed)
[facility]    CSN: [id number] Arrival date & time: 03/27/21  1554      History   Chief Complaint Chief Complaint  Patient presents with   Vaginal Discharge    HPI Melissa Davies is a 50 y.o. female.   Patient here today for evaluation of thin discharge has been present intermittently for the last several months. She has not had any new sexual partners but states her current partner has given her STD in the past. She would like STD screening. She denies any odor to discharge. She does report some vaginal discomfort with sex recently.   The history is provided by the patient.  Vaginal Discharge Associated symptoms: no abdominal pain, no fever, no nausea and no vomiting    Past Medical History:  Diagnosis Date   ADHD    Anxiety    PTSD (post-traumatic stress disorder)     Patient Active Problem List   Diagnosis Date Noted   Bipolar I disorder, most recent episode depressed (HCC) 10/25/2019   MDD (major depressive disorder), recurrent episode, severe (HCC) 07/22/2019   Major depressive disorder, recurrent episode (HCC) 07/22/2019   Suicidal ideation 07/21/2019   PTSD (post-traumatic stress disorder) 09/13/2013   Depression, reactive 05/24/2013   ADD (attention deficit disorder) 09/26/2012   Generalized anxiety disorder 09/26/2012   BMI 36.0-36.9,adult 09/26/2012    Past Surgical History:  Procedure Laterality Date   TUBAL LIGATION      OB History     Gravida  3   Para  3   Term  3   Preterm      AB      Living  3      SAB      IAB      Ectopic      Multiple      Live Births  3            Home Medications    Prior to Admission medications   Medication Sig Start Date End Date Taking? Authorizing Provider  metroNIDAZOLE (FLAGYL) 500 MG tablet Take 1 tablet (500 mg total) by mouth 2 (two) times daily. 03/27/21  Yes Tomi Bamberger, PA-C  buPROPion (WELLBUTRIN XL) 150 MG 24 hr tablet Take 1 tablet (150 mg total) by  mouth daily. 07/23/20   Shanna Cisco, NP  nitrofurantoin, macrocrystal-monohydrate, (MACROBID) 100 MG capsule Take 1 capsule (100 mg total) by mouth 2 (two) times daily. 11/27/20   Gustavus Bryant, FNP  FLUoxetine (PROZAC) 20 MG capsule Take 1 capsule (20 mg total) by mouth daily. Patient not taking: Reported on 10/31/2019 07/25/19 10/31/19  Aldean Baker, NP    Family History Family History  Problem Relation Age of Onset   ADD / ADHD Son     Social History Social History   Tobacco Use   Smoking status: Former    Years: 1.00    Types: Cigarettes   Smokeless tobacco: Never  Vaping Use   Vaping Use: Never used  Substance Use Topics   Alcohol use: Not Currently   Drug use: Yes    Types: Marijuana     Allergies   Patient has no known allergies.   Review of Systems Review of Systems  Constitutional:  Negative for chills and fever.  Eyes:  Negative for discharge and redness.  Respiratory:  Negative for shortness of breath.   Gastrointestinal:  Negative for abdominal pain, nausea and vomiting.  Genitourinary:  Positive for vaginal bleeding, vaginal discharge  and vaginal pain.    Physical Exam Triage Vital Signs ED Triage Vitals [03/27/21 1715]  Enc Vitals Group     BP 125/86     Pulse Rate 68     Resp 18     Temp 97.7 F (36.5 C)     Temp Source Oral     SpO2 98 %     Weight      Height      Head Circumference      Peak Flow      Pain Score 0     Pain Loc      Pain Edu?      Excl. in GC?    No data found.  Updated Vital Signs BP 125/86 (BP Location: Left Arm)    Pulse 68    Temp 97.7 F (36.5 C) (Oral)    Resp 18    LMP 05/11/2016 (Approximate)    SpO2 98%   Physical Exam Vitals and nursing note reviewed.  Constitutional:      General: She is not in acute distress.    Appearance: Normal appearance. She is not ill-appearing.  HENT:     Head: Normocephalic and atraumatic.  Eyes:     Conjunctiva/sclera: Conjunctivae normal.  Cardiovascular:      Rate and Rhythm: Normal rate.  Pulmonary:     Effort: Pulmonary effort is normal.  Neurological:     Mental Status: She is alert.  Psychiatric:        Mood and Affect: Mood normal.        Behavior: Behavior normal.        Thought Content: Thought content normal.     UC Treatments / Results  Labs (all labs ordered are listed, but only abnormal results are displayed) Labs Reviewed  HIV ANTIBODY (ROUTINE TESTING W REFLEX)  HEPATITIS PANEL, ACUTE  RPR  POCT URINALYSIS DIP (MANUAL ENTRY)  CERVICOVAGINAL ANCILLARY ONLY    EKG   Radiology No results found.  Procedures Procedures (including critical care time)  Medications Ordered in UC Medications - No data to display  Initial Impression / Assessment and Plan / UC Course  I have reviewed the triage vital signs and the nursing notes.  Pertinent labs & imaging results that were available during my care of the patient were reviewed by me and considered in my medical decision making (see chart for details).    STD screening ordered. Metronidazole prescribed for treatment of possible BV while awaiting results and discussed boric acid suppositories. Will await results for further treatment recommendation.   Final Clinical Impressions(s) / UC Diagnoses   Final diagnoses:  Vaginal discharge  Screening for STD (sexually transmitted disease)     Discharge Instructions      You can search and find "boric acid suppositories" on [employer] if unable to find in local stores to purchase.      ED Prescriptions     Medication Sig Dispense Auth. Provider   metroNIDAZOLE (FLAGYL) 500 MG tablet Take 1 tablet (500 mg total) by mouth 2 (two) times daily. 14 tablet Tomi Bamberger, PA-C      PDMP not reviewed this encounter.   Tomi Bamberger, PA-C 03/27/21 1919

## 2021-03-27 NOTE — ED Triage Notes (Signed)
Pt c/o vaginal discharge. States she was seen in Aug with this symptom and tested (-) for infection. States discharge has been intermittent since. Discharge is yellow thin without odor, hurts with vaginal sex,   Denies burning, itching, stinging, dysuria, hematuria, frequency, oliguria.    Onset about a month ago.

## 2021-03-27 NOTE — Discharge Instructions (Addendum)
You can search and find "boric acid suppositories" on [employer] if unable to find in local stores to purchase.

## 2021-03-28 LAB — CERVICOVAGINAL ANCILLARY ONLY
Bacterial Vaginitis (gardnerella): NEGATIVE
Candida Glabrata: NEGATIVE
Candida Vaginitis: NEGATIVE
Comment: NEGATIVE
Comment: NEGATIVE
Comment: NEGATIVE
Comment: NEGATIVE
Trichomonas: NEGATIVE

## 2021-03-28 LAB — RPR: RPR Ser Ql: NONREACTIVE

## 2021-03-28 LAB — HIV ANTIBODY (ROUTINE TESTING W REFLEX): HIV Screen 4th Generation wRfx: NONREACTIVE

## 2021-03-30 LAB — ACUTE VIRAL HEPATITIS (HAV, HBV, HCV)
HCV Ab: <0.1 s/co ratio (ref 0.0–0.9)
Hep A IgM: NEGATIVE
Hep B C IgM: NEGATIVE
Hepatitis B Surface Ag: NEGATIVE

## 2021-03-30 LAB — HCV INTERPRETATION

## 2021-03-30 LAB — SPECIMEN STATUS REPORT

## 2022-04-01 ENCOUNTER — Ambulatory Visit: Payer: Self-pay

## 2022-04-06 IMAGING — CR DG WRIST COMPLETE 3+V*L*
4 series · 4 of 4 positions shown · non-contrast
Comparison: None.

CLINICAL DATA: Increasing pain and swelling without recent trauma
history. Is

EXAM:
LEFT WRIST - COMPLETE 3+ VIEW

[x wrist pa left]
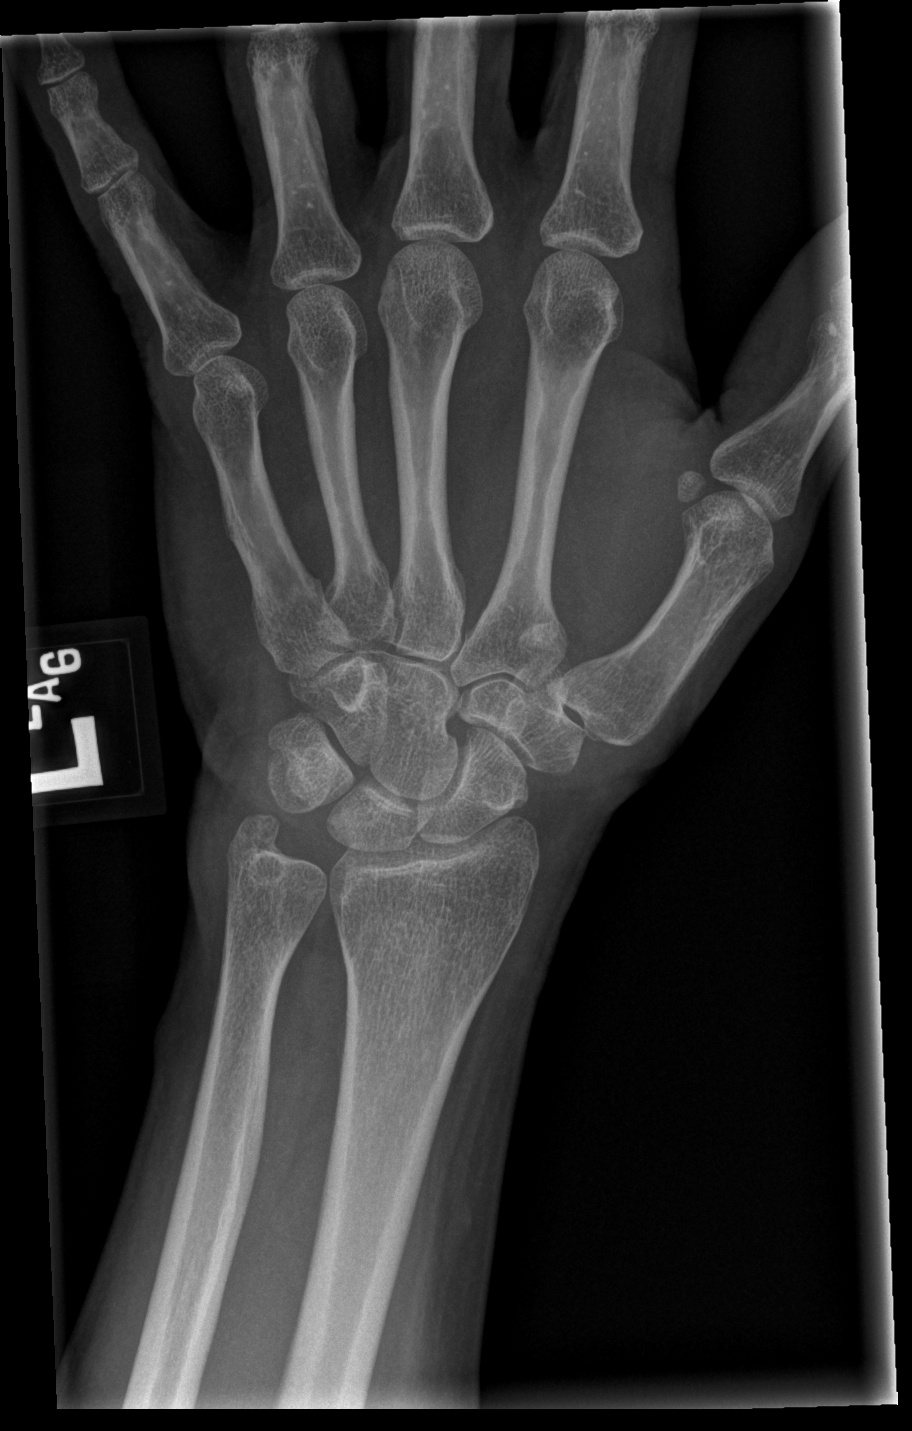

[x wrist obl left]
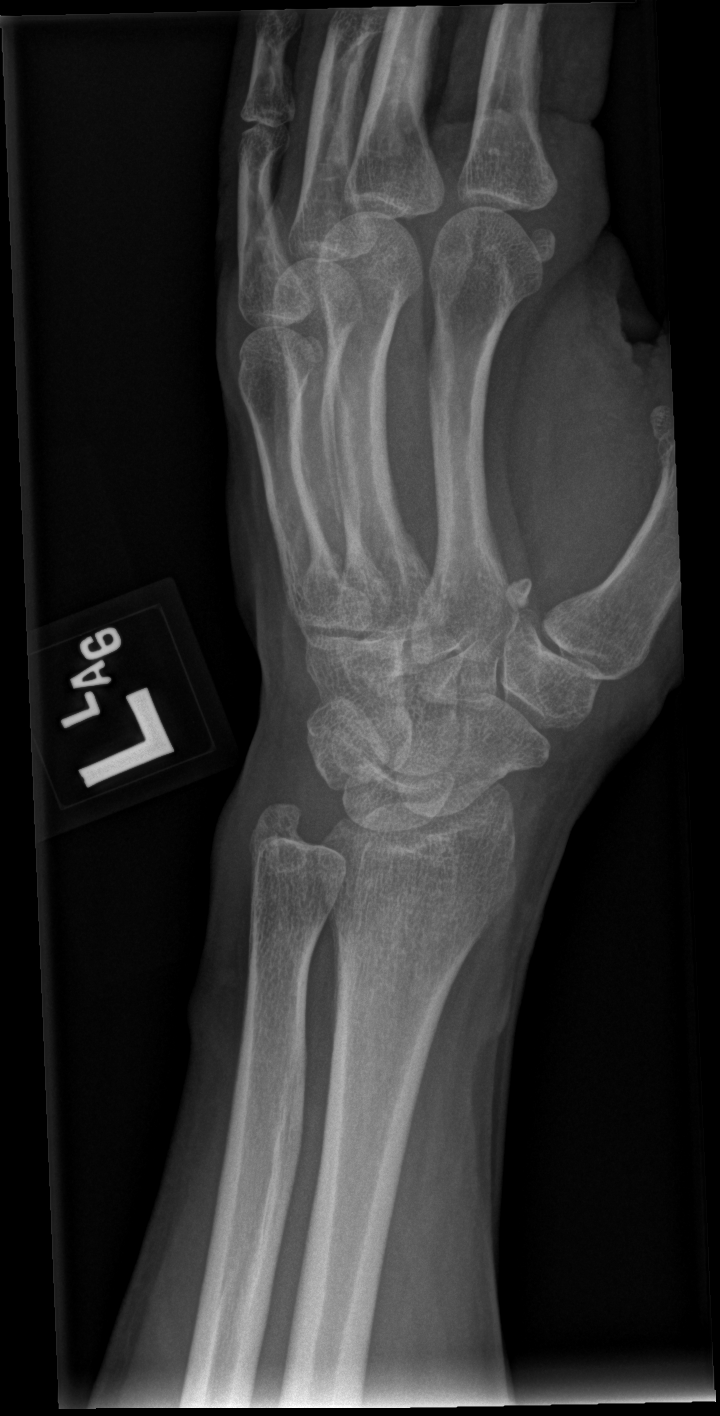

[x wrist lat left]
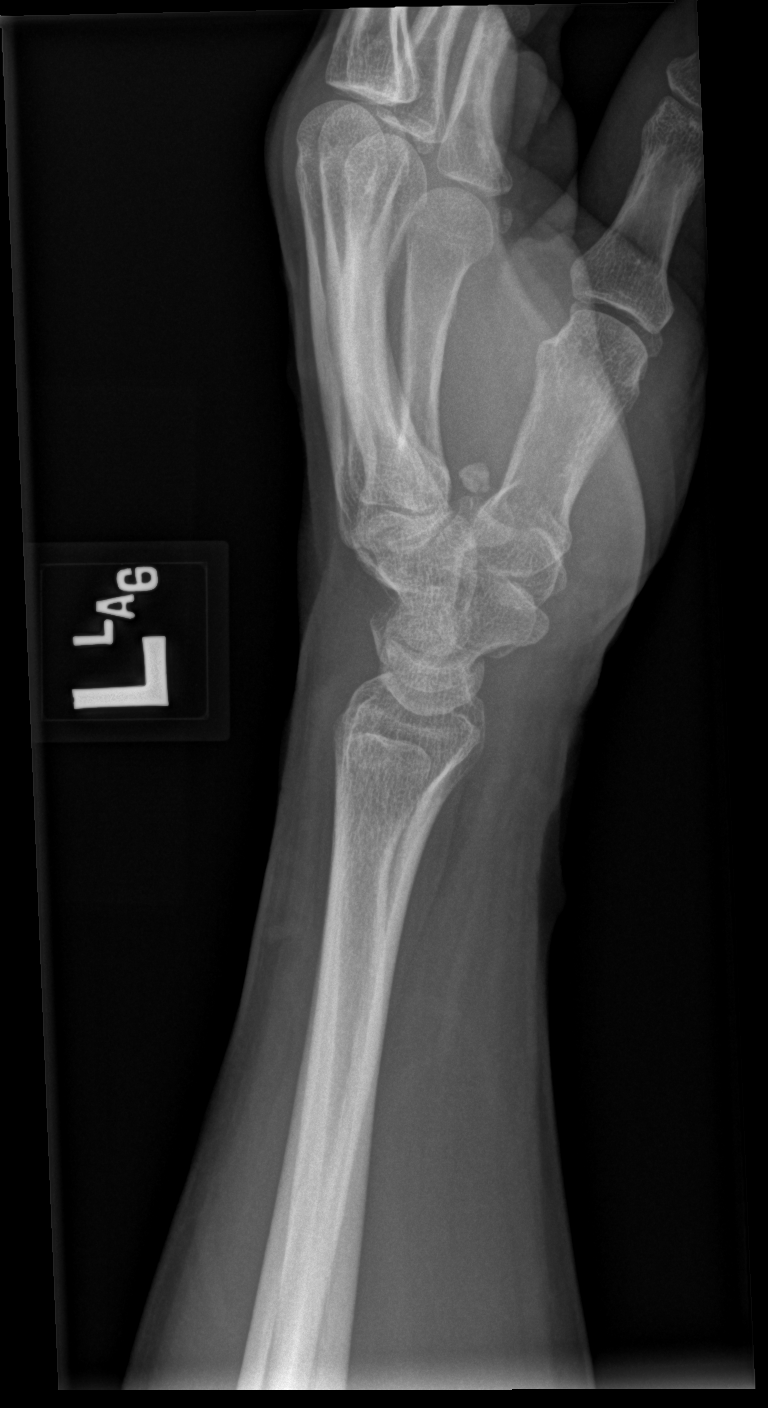

[x wrist navicular view left]
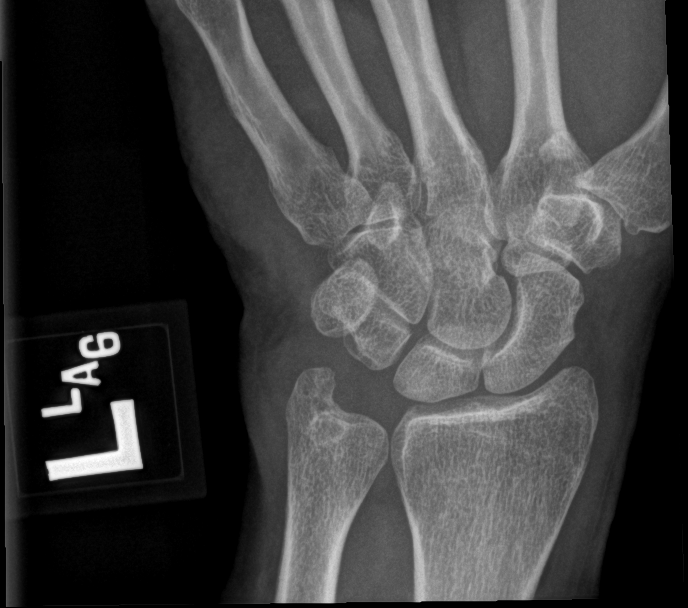

[4 of 4 positions shown; findings below may reference images not displayed]

FINDINGS: There is no evidence of fracture or dislocation. There is mild
narrowing and spurring of the first CMC joint. Other joint spaces
are maintained. There is dorsal soft tissue fullness eccentric
towards the ulnar aspect. No foreign body density is seen.

There is a 6 mm calcified body ventrally in between the base of the
thumb and index metacarpals, which could be an intra-articular loose
body or dystrophic calcification from old trauma.
IMPRESSION: 1. No fracture or other acute abnormality is seen.
2. Swelling dorsally, greater towards the ulnar aspect.
3. 6 mm calcified body ventrally in between the base of the thumb
and index metacarpals.
4. Nonerosive degenerative arthrosis of the first CMC joint.

## 2022-04-15 ENCOUNTER — Encounter: Payer: Self-pay | Admitting: Podiatry

## 2022-04-15 ENCOUNTER — Ambulatory Visit (INDEPENDENT_AMBULATORY_CARE_PROVIDER_SITE_OTHER): Payer: No Typology Code available for payment source | Admitting: Podiatry

## 2022-04-15 DIAGNOSIS — L600 Ingrowing nail: Secondary | ICD-10-CM

## 2022-04-15 NOTE — Progress Notes (Signed)
Subjective:  Patient ID: Melissa Davies, female    DOB: Oct 17, 1970,  MRN: [id number]  Chief Complaint  Patient presents with   Nail Problem    Left great hallux nail     52 y.o. female presents with the above complaint.  Patient presents with left hallux great toe nail dystrophy.  Patient states that has been painful to touch is progressive gotten worse.  She would like to have it removed and would like to make it permanent she has not seen anyone else prior to seeing me denies any other acute complaints hurts with ambulation worse with pressure.  Pain scale 7 out of 10   Review of Systems: Negative except as noted in the HPI. Denies N/V/F/Ch.  Past Medical History:  Diagnosis Date   ADHD    Anxiety    PTSD (post-traumatic stress disorder)     Current Outpatient Medications:    diclofenac (CATAFLAM) 50 MG tablet, Take 50 mg by mouth 2 (two) times daily as needed., Disp: , Rfl:    lamoTRIgine (LAMICTAL) 25 MG tablet, Take 50 mg by mouth daily., Disp: , Rfl:    oseltamivir (TAMIFLU) 75 MG capsule, Take 75 mg by mouth 2 (two) times daily., Disp: , Rfl:    buPROPion (WELLBUTRIN XL) 150 MG 24 hr tablet, Take 1 tablet (150 mg total) by mouth daily., Disp: 30 tablet, Rfl: 2   loratadine (CLARITIN) 10 MG tablet, Take by mouth., Disp: , Rfl:    metroNIDAZOLE (FLAGYL) 500 MG tablet, Take 1 tablet (500 mg total) by mouth 2 (two) times daily., Disp: 14 tablet, Rfl: 0   nitrofurantoin, macrocrystal-monohydrate, (MACROBID) 100 MG capsule, Take 1 capsule (100 mg total) by mouth 2 (two) times daily., Disp: 10 capsule, Rfl: 0  Current Facility-Administered Medications:    azithromycin (ZITHROMAX) tablet 1,000 mg, 1,000 mg, Oral, Once, Sloan Leiter, MD  Social History   Tobacco Use  Smoking Status Former   Years: 1.00   Types: Cigarettes  Smokeless Tobacco Never    Allergies  Allergen Reactions   Clonazepam Rash    Pt is currently on med   Objective:  There were no vitals filed  for this visit. There is no height or weight on file to calculate BMI. Constitutional Well developed. Well nourished.  Vascular Dorsalis pedis pulses palpable bilaterally. Posterior tibial pulses palpable bilaterally. Capillary refill normal to all digits.  No cyanosis or clubbing noted. Pedal hair growth normal.  Neurologic Normal speech. Oriented to person, place, and time. Epicritic sensation to light touch grossly present bilaterally.  Dermatologic Pain on palpation of the entire/total nail on 1st digit of the left No other open wounds. No skin lesions.  Orthopedic: Normal joint ROM without pain or crepitus bilaterally. No visible deformities. No bony tenderness.   Radiographs: None Assessment:   1. Ingrown left big toenail    Plan:  Patient was evaluated and treated and all questions answered.  Nail contusion/dystrophy hallux, left with underlying ingrown -Patient elects to proceed with minor surgery to remove entire toenail today. Consent reviewed and signed by patient. -Entire/total nail excised. See procedure note. -Educated on post-procedure care including soaking. Written instructions provided and reviewed. -Patient to follow up in 2 weeks for nail check.  Procedure: Excision of entire/total nail with phenol matricectomy Location: Left 1st toe digit Anesthesia: Lidocaine 1% plain; 1.5 mL and Marcaine 0.5% plain; 1.5 mL, digital block. Skin Prep: Betadine. Dressing: Silvadene; telfa; dry, sterile, compression dressing. Technique: Following skin prep, the toe was  exsanguinated and a tourniquet was secured at the base of the toe. The affected nail border was freed and excised. The tourniquet was then removed and sterile dressing applied. Disposition: Patient tolerated procedure well. Patient to return in 2 weeks for follow-up.   No follow-ups on file.

## 2022-05-19 DIAGNOSIS — R7989 Other specified abnormal findings of blood chemistry: Secondary | ICD-10-CM | POA: Insufficient documentation

## 2022-05-19 DIAGNOSIS — E6609 Other obesity due to excess calories: Secondary | ICD-10-CM | POA: Insufficient documentation

## 2022-05-19 DIAGNOSIS — F39 Unspecified mood [affective] disorder: Secondary | ICD-10-CM | POA: Insufficient documentation

## 2022-11-27 ENCOUNTER — Ambulatory Visit: Payer: No Typology Code available for payment source | Admitting: Internal Medicine

## 2023-03-09 DIAGNOSIS — G5601 Carpal tunnel syndrome, right upper limb: Secondary | ICD-10-CM | POA: Insufficient documentation

## 2023-05-23 ENCOUNTER — Ambulatory Visit (HOSPITAL_COMMUNITY)
Admission: EM | Admit: 2023-05-23 | Discharge: 2023-05-23 | Disposition: A | Discharge status: Home / Self Care | Payer: 59 | Attending: Internal Medicine | Admitting: Internal Medicine

## 2023-05-23 ENCOUNTER — Encounter (HOSPITAL_COMMUNITY): Payer: Self-pay | Admitting: Emergency Medicine

## 2023-05-23 DIAGNOSIS — R42 Dizziness and giddiness: Secondary | ICD-10-CM | POA: Diagnosis not present

## 2023-05-23 MED ORDER — MECLIZINE HCL 25 MG PO TABS: 25.0000 mg | ORAL_TABLET | Freq: Once | ORAL | Status: DC

## 2023-05-23 MED ORDER — PREDNISONE 20 MG PO TABS: 40.0000 mg | ORAL_TABLET | Freq: Every day | ORAL | 0 refills | Status: AC

## 2023-05-23 MED ORDER — MECLIZINE HCL 25 MG PO TABS: 25.0000 mg | ORAL_TABLET | Freq: Three times a day (TID) | ORAL | 1 refills | Status: DC | PRN

## 2023-05-23 MED ORDER — DEXAMETHASONE SODIUM PHOSPHATE 10 MG/ML IJ SOLN
10.0000 mg | Freq: Once | INTRAMUSCULAR | Status: AC
  Administered 2023-05-23: 10 mg via INTRAMUSCULAR

## 2023-05-23 MED ORDER — DEXAMETHASONE SODIUM PHOSPHATE 10 MG/ML IJ SOLN
INTRAMUSCULAR | Status: AC
  Filled 2023-05-23: qty 1

## 2023-05-23 MED ORDER — HYDROXYZINE HCL 25 MG PO TABS: 25.0000 mg | ORAL_TABLET | Freq: Every evening | ORAL | 1 refills | Status: AC | PRN

## 2023-05-23 NOTE — Discharge Instructions (Addendum)
 Symptoms consistent with vertigo. This is condition can be worsened by changes in work schedules or stress. We will treat with the following:  Decadron  injection given today. This is a steroid to help with inflammation and pain.  Meclizine  25 mg 3 times daily as needed for dizziness Prednisone  40 mg (2 tablets) once daily for 5 days. Take this in the morning.  This is a steroid to help with inflammation and pain. Start this tomorrow 2/10 Hydroxyzine  25 mg at bedtime as needed for sleep Keep appointment with primary care physician for further workup of vertigo symptoms Go to the emergency room if your symptoms worsen or fail to improve as more advanced imaging may be needed

## 2023-05-23 NOTE — ED Notes (Signed)
 Turned off lights in treatment room.  Slight light from curtained window

## 2023-05-23 NOTE — ED Provider Notes (Signed)
 [facility]    CSN: [id number] Arrival date & time: 05/23/23  1038      History   Chief Complaint Chief Complaint  Patient presents with   Dizziness    HPI Melissa Davies is a 53 y.o. female.   53 year old female who presents to urgent care with complaints of dizziness.  This started about a week ago but just is not getting any better.  She reports that she has had vertigo but this was about 8 years ago and at that time her symptoms were that the room was spinning.  She has had occasional dizzy spells but nothing severe.  She also relates that she was getting lab work done at 1 time which showed she had a low vitamin D and low vitamin B-12 and was taking supplements for this but has not had any in a while.  She also recently went from first shift to third shift and is having a lot of trouble sleeping and adjusting to this schedule.  She denies any recent illnesses except for a brief episode of food poisoning about 2 weeks ago.  She denies fevers, chills, nausea, vomiting, unilateral symptoms, blurred vision, double vision.     Dizziness Associated symptoms: no chest pain, no palpitations, no shortness of breath and no vomiting     Past Medical History:  Diagnosis Date   ADHD    Anxiety    PTSD (post-traumatic stress disorder)     Patient Active Problem List   Diagnosis Date Noted   Acute otalgia, right 04/19/2020   Hearing loss of right ear due to cerumen impaction 04/19/2020   Bipolar I disorder, most recent episode depressed (HCC) 10/25/2019   MDD (major depressive disorder), recurrent episode, severe (HCC) 07/22/2019   Major depressive disorder, recurrent episode (HCC) 07/22/2019   Suicidal ideation 07/21/2019   PTSD (post-traumatic stress disorder) 09/13/2013   Depression, reactive 05/24/2013   ADD (attention deficit disorder) 09/26/2012   Generalized anxiety disorder 09/26/2012   BMI 36.0-36.9,adult 09/26/2012    Past Surgical History:   Procedure Laterality Date   TUBAL LIGATION      OB History     Gravida  3   Para  3   Term  3   Preterm      AB      Living  3      SAB      IAB      Ectopic      Multiple      Live Births  3            Home Medications    Prior to Admission medications   Medication Sig Start Date End Date Taking? Authorizing Provider  FLUoxetine  (PROZAC ) 20 MG capsule Take 1 capsule (20 mg total) by mouth daily. Patient not taking: Reported on 10/31/2019 07/25/19 10/31/19  Wonda Clarita BRAVO, NP    Family History Family History  Problem Relation Age of Onset   ADD / ADHD Son     Social History Social History   Tobacco Use   Smoking status: Former    Types: Cigarettes   Smokeless tobacco: Never  Vaping Use   Vaping status: Never Used  Substance Use Topics   Alcohol use: Not Currently   Drug use: Yes    Types: Marijuana     Allergies   Clonazepam    Review of Systems Review of Systems  Constitutional:  Negative for chills and fever.  HENT:  Negative for ear pain and  sore throat.   Eyes:  Negative for pain and visual disturbance.  Respiratory:  Negative for cough and shortness of breath.   Cardiovascular:  Negative for chest pain and palpitations.  Gastrointestinal:  Negative for abdominal pain and vomiting.  Genitourinary:  Negative for dysuria and hematuria.  Musculoskeletal:  Negative for arthralgias and back pain.  Skin:  Negative for color change and rash.  Neurological:  Positive for dizziness. Negative for seizures and syncope.  All other systems reviewed and are negative.    Physical Exam Triage Vital Signs ED Triage Vitals  Encounter Vitals Group     BP 05/23/23 1123 (!) 145/81     Systolic BP Percentile --      Diastolic BP Percentile --      Pulse Rate 05/23/23 1123 69     Resp 05/23/23 1123 18     Temp 05/23/23 1123 97.9 F (36.6 C)     Temp Source 05/23/23 1123 Oral     SpO2 05/23/23 1123 98 %     Weight --      Height --       Head Circumference --      Peak Flow --      Pain Score 05/23/23 1125 0     Pain Loc --      Pain Education --      Exclude from Growth Chart --    No data found.  Updated Vital Signs BP (!) 145/81 (BP Location: Left Arm)   Pulse 69   Temp 97.9 F (36.6 C) (Oral)   Resp 18   LMP 05/11/2016 (Approximate)   SpO2 98%   Visual Acuity Right Eye Distance:   Left Eye Distance:   Bilateral Distance:    Right Eye Near:   Left Eye Near:    Bilateral Near:     Physical Exam Vitals and nursing note reviewed.  Constitutional:      General: She is not in acute distress.    Appearance: She is well-developed.  HENT:     Head: Normocephalic and atraumatic.  Eyes:     Conjunctiva/sclera: Conjunctivae normal.  Cardiovascular:     Rate and Rhythm: Normal rate and regular rhythm.     Heart sounds: No murmur heard. Pulmonary:     Effort: Pulmonary effort is normal. No respiratory distress.     Breath sounds: Normal breath sounds.  Abdominal:     Palpations: Abdomen is soft.     Tenderness: There is no abdominal tenderness.  Musculoskeletal:        General: No swelling.     Cervical back: Neck supple.  Skin:    General: Skin is warm and dry.     Capillary Refill: Capillary refill takes less than 2 seconds.  Neurological:     General: No focal deficit present.     Mental Status: She is alert and oriented to person, place, and time.     Cranial Nerves: Cranial nerves 2-12 are intact.     Sensory: Sensation is intact.     Motor: Motor function is intact.     Coordination: Coordination is intact.     Gait: Gait is intact.  Psychiatric:        Mood and Affect: Mood normal.      UC Treatments / Results  Labs (all labs ordered are listed, but only abnormal results are displayed) Labs Reviewed - No data to display  EKG   Radiology No results found.  Procedures Procedures (including critical  care time)  Medications Ordered in UC Medications - No data to  display  Initial Impression / Assessment and Plan / UC Course  I have reviewed the triage vital signs and the nursing notes.  Pertinent labs & imaging results that were available during my care of the patient were reviewed by me and considered in my medical decision making (see chart for details).     Vertigo   Symptoms consistent with vertigo. This is condition can be worsened by changes in work schedules or stress. We will treat with the following:  Decadron  injection given today. This is a steroid to help with inflammation and pain.  Meclizine  25 mg 3 times daily as needed for dizziness Prednisone  40 mg (2 tablets) once daily for 5 days. Take this in the morning.  This is a steroid to help with inflammation and pain. Start this tomorrow 2/10 Hydroxyzine  25 mg at bedtime as needed for sleep Keep appointment with primary care physician for further workup of vertigo symptoms Go to the emergency room if your symptoms worsen or fail to improve as more advanced imaging may be needed  Final Clinical Impressions(s) / UC Diagnoses   Final diagnoses:  None   Discharge Instructions   None    ED Prescriptions   None    PDMP not reviewed this encounter.   Teresa Almarie LABOR, PA-C 05/23/23 1318

## 2023-05-23 NOTE — ED Triage Notes (Signed)
 Patient presents with c/o dizziness x 1 week. Patient states she had food poisoning x 2 weeks ago.  Patient denies any other symptoms at this time.

## 2023-06-24 ENCOUNTER — Ambulatory Visit (HOSPITAL_COMMUNITY): Payer: Self-pay

## 2023-08-09 ENCOUNTER — Ambulatory Visit (HOSPITAL_COMMUNITY)
Admission: EM | Admit: 2023-08-09 | Discharge: 2023-08-09 | Disposition: A | Discharge status: Home / Self Care | Payer: MEDICAID | Attending: Emergency Medicine | Admitting: Emergency Medicine

## 2023-08-09 ENCOUNTER — Encounter (HOSPITAL_COMMUNITY): Payer: Self-pay | Admitting: *Deleted

## 2023-08-09 ENCOUNTER — Ambulatory Visit (INDEPENDENT_AMBULATORY_CARE_PROVIDER_SITE_OTHER): Payer: MEDICAID

## 2023-08-09 DIAGNOSIS — M25531 Pain in right wrist: Secondary | ICD-10-CM | POA: Diagnosis not present

## 2023-08-09 DIAGNOSIS — M25532 Pain in left wrist: Secondary | ICD-10-CM | POA: Diagnosis not present

## 2023-08-09 DIAGNOSIS — M542 Cervicalgia: Secondary | ICD-10-CM

## 2023-08-09 MED ORDER — IBUPROFEN 800 MG PO TABS
800.0000 mg | ORAL_TABLET | Freq: Once | ORAL | Status: AC
  Administered 2023-08-09: 800 mg via ORAL

## 2023-08-09 MED ORDER — METHOCARBAMOL 500 MG PO TABS: 500.0000 mg | ORAL_TABLET | Freq: Two times a day (BID) | ORAL | 0 refills | Status: AC

## 2023-08-09 MED ORDER — IBUPROFEN 800 MG PO TABS
ORAL_TABLET | ORAL | Status: AC
  Filled 2023-08-09: qty 1

## 2023-08-09 NOTE — Discharge Instructions (Signed)
 The official radiology overread of your images will be back over the next few hours.  I will contact you if anything results is abnormal.  No news is good news.  You will wake up in more pain tomorrow, as your muscle stiffen.  You can take the Robaxin  up to 2 times daily to help with this, do not drink alcohol or drive on this medication as it may cause drowsiness or sedation.  Take this tonight before bed.  You can also take 800 mg of ibuprofen every 8 hours.  Heat, gentle stretching and warm compresses can help loosen up stiff muscles.  If your shoulder, back, neck or pains persist over the next week, please follow-up with an orthopedic for further evaluations.
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
## 2023-08-13 ENCOUNTER — Ambulatory Visit: Payer: MEDICAID | Admitting: Sports Medicine

## 2023-08-17 ENCOUNTER — Encounter: Payer: Self-pay | Admitting: Family Medicine

## 2023-08-17 ENCOUNTER — Ambulatory Visit (INDEPENDENT_AMBULATORY_CARE_PROVIDER_SITE_OTHER): Payer: MEDICAID | Admitting: Family Medicine

## 2023-08-17 VITALS — BP 123/85 | Ht 61.0 in | Wt 200.0 lb

## 2023-08-17 DIAGNOSIS — M79642 Pain in left hand: Secondary | ICD-10-CM

## 2023-08-17 DIAGNOSIS — M5030 Other cervical disc degeneration, unspecified cervical region: Secondary | ICD-10-CM

## 2023-08-17 DIAGNOSIS — S161XXA Strain of muscle, fascia and tendon at neck level, initial encounter: Secondary | ICD-10-CM | POA: Insufficient documentation

## 2023-08-17 DIAGNOSIS — M79641 Pain in right hand: Secondary | ICD-10-CM | POA: Insufficient documentation

## 2023-08-17 MED ORDER — MELOXICAM 15 MG PO TABS: 15.0000 mg | ORAL_TABLET | Freq: Every day | ORAL | 0 refills | Status: DC
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
## 2023-08-18 DIAGNOSIS — M5030 Other cervical disc degeneration, unspecified cervical region: Secondary | ICD-10-CM | POA: Insufficient documentation

## 2023-08-18 NOTE — Addendum Note (Signed)
 Addended by: Marvel Slicker C on: 08/18/2023 11:00 AM   Modules accepted: Level of Service
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
## 2023-09-03 ENCOUNTER — Encounter: Payer: Self-pay | Admitting: Occupational Therapy

## 2023-09-03 ENCOUNTER — Ambulatory Visit: Payer: MEDICAID | Attending: Nurse Practitioner | Admitting: Occupational Therapy

## 2023-09-03 ENCOUNTER — Other Ambulatory Visit: Payer: Self-pay

## 2023-09-03 DIAGNOSIS — M6281 Muscle weakness (generalized): Secondary | ICD-10-CM | POA: Diagnosis present

## 2023-09-03 DIAGNOSIS — R29818 Other symptoms and signs involving the nervous system: Secondary | ICD-10-CM

## 2023-09-03 DIAGNOSIS — R278 Other lack of coordination: Secondary | ICD-10-CM | POA: Diagnosis present

## 2023-09-03 DIAGNOSIS — R29898 Other symptoms and signs involving the musculoskeletal system: Secondary | ICD-10-CM

## 2023-09-03 DIAGNOSIS — R208 Other disturbances of skin sensation: Secondary | ICD-10-CM

## 2023-09-03 DIAGNOSIS — M79641 Pain in right hand: Secondary | ICD-10-CM

## 2023-09-03 DIAGNOSIS — M79642 Pain in left hand: Secondary | ICD-10-CM | POA: Diagnosis present

## 2023-09-03 NOTE — Patient Instructions (Signed)
 CONTRAST BATHS  Definition-a form of alternating heat and cold therapy used to relieve swelling in the hand/arm or swelling within or around nerves & tendons.  It is used before, during, or after exercises or separately throughout the day.  It is important that you follow the directions specifically.  Instructions-Find a reliable way to apply heat (heating pad, rice sock, etc.)  And also way to apply cold (ice and freezer bag, cold pack, ball with ice water , etc.)  And set them up side-by-side on a table.  You will also need a timer or stopwatch.  Begin by placing your hand in the heat for 3 minutes.  After 3 minutes in the heat, remove your hand and transfer into the cold for 1 minute.  Repeat this process for 20 minutes (5 cycles of 3 min heat/1 min cold)  This can be done 1-3 times per day to help reduce swelling, pain, and promote movement in the wrist/fingers.   Heat Home Program Heat is used as part of your therapy for several reasons.  Heat increases blood flow, which promotes healing. Heat also relaxes muscles and joints which makes exercising easier.  Complete BEFORE exercise to help manage pain and increase stretch.  It can be applied for __10-15_ minutes,  _up to 3_ sessions per day  Use one of the following methods that is most convenient for you  Heating pad: Follow instructions given by manufacturer.  Use on low-medium setting.    Moist heated towel: Soak towel in hot water  or place damp towel in microwave and heat for 30 sec. Check temperature to determine if further time needed.  Wring out towel and wrap around affected area, cover with a plastic bag.  Can also wrap a dry towel around the plastic to help retain the heat.    Microwave gel pack: Follow instructions given by manufacturer.  Check temperature before application to ensure not hot enough to cause a burn    Rice sock: This can be made using a sock with no synthetic material and 1.5 cups of rice.  Pour the  rice into the sock, tie a knot at the top.  Place in microwave for 1 minute, remove to check temperature to determine if further heating time is required prior to application  Be sure to check the skin periodically to ensure no excessive redness or blisters.  Use with caution in areas with decreased sensation

## 2023-09-07 ENCOUNTER — Ambulatory Visit: Payer: MEDICAID | Admitting: Occupational Therapy

## 2023-09-07 DIAGNOSIS — R29818 Other symptoms and signs involving the nervous system: Secondary | ICD-10-CM

## 2023-09-07 DIAGNOSIS — R208 Other disturbances of skin sensation: Secondary | ICD-10-CM

## 2023-09-07 DIAGNOSIS — R29898 Other symptoms and signs involving the musculoskeletal system: Secondary | ICD-10-CM | POA: Diagnosis not present

## 2023-09-07 DIAGNOSIS — M79641 Pain in right hand: Secondary | ICD-10-CM

## 2023-09-07 DIAGNOSIS — R278 Other lack of coordination: Secondary | ICD-10-CM

## 2023-09-07 DIAGNOSIS — M6281 Muscle weakness (generalized): Secondary | ICD-10-CM

## 2023-09-07 NOTE — Patient Instructions (Addendum)
 Melissa Davies
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
## 2023-09-10 ENCOUNTER — Ambulatory Visit: Payer: MEDICAID | Admitting: Occupational Therapy

## 2023-09-10 DIAGNOSIS — M6281 Muscle weakness (generalized): Secondary | ICD-10-CM

## 2023-09-10 DIAGNOSIS — M79641 Pain in right hand: Secondary | ICD-10-CM

## 2023-09-10 DIAGNOSIS — R278 Other lack of coordination: Secondary | ICD-10-CM

## 2023-09-10 DIAGNOSIS — R208 Other disturbances of skin sensation: Secondary | ICD-10-CM

## 2023-09-10 DIAGNOSIS — R29898 Other symptoms and signs involving the musculoskeletal system: Secondary | ICD-10-CM

## 2023-09-10 DIAGNOSIS — R29818 Other symptoms and signs involving the nervous system: Secondary | ICD-10-CM
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
## 2023-09-10 NOTE — Patient Instructions (Signed)

## 2023-09-13 ENCOUNTER — Ambulatory Visit: Payer: MEDICAID | Attending: Nurse Practitioner | Admitting: Occupational Therapy

## 2023-09-13 DIAGNOSIS — M79642 Pain in left hand: Secondary | ICD-10-CM | POA: Diagnosis present

## 2023-09-13 DIAGNOSIS — R29818 Other symptoms and signs involving the nervous system: Secondary | ICD-10-CM

## 2023-09-13 DIAGNOSIS — M79641 Pain in right hand: Secondary | ICD-10-CM | POA: Diagnosis present

## 2023-09-13 DIAGNOSIS — M6281 Muscle weakness (generalized): Secondary | ICD-10-CM | POA: Diagnosis present

## 2023-09-13 DIAGNOSIS — M25512 Pain in left shoulder: Secondary | ICD-10-CM | POA: Diagnosis present

## 2023-09-13 DIAGNOSIS — M542 Cervicalgia: Secondary | ICD-10-CM | POA: Insufficient documentation

## 2023-09-13 DIAGNOSIS — R29898 Other symptoms and signs involving the musculoskeletal system: Secondary | ICD-10-CM | POA: Diagnosis present

## 2023-09-13 DIAGNOSIS — L905 Scar conditions and fibrosis of skin: Secondary | ICD-10-CM | POA: Insufficient documentation

## 2023-09-13 DIAGNOSIS — R208 Other disturbances of skin sensation: Secondary | ICD-10-CM | POA: Diagnosis present

## 2023-09-13 DIAGNOSIS — M25511 Pain in right shoulder: Secondary | ICD-10-CM | POA: Insufficient documentation

## 2023-09-13 DIAGNOSIS — R278 Other lack of coordination: Secondary | ICD-10-CM
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
## 2023-09-15 ENCOUNTER — Ambulatory Visit: Payer: MEDICAID | Admitting: Occupational Therapy

## 2023-09-15 DIAGNOSIS — M79642 Pain in left hand: Secondary | ICD-10-CM

## 2023-09-15 DIAGNOSIS — M6281 Muscle weakness (generalized): Secondary | ICD-10-CM

## 2023-09-15 DIAGNOSIS — R29898 Other symptoms and signs involving the musculoskeletal system: Secondary | ICD-10-CM | POA: Diagnosis not present

## 2023-09-15 DIAGNOSIS — R278 Other lack of coordination: Secondary | ICD-10-CM

## 2023-09-15 DIAGNOSIS — R208 Other disturbances of skin sensation: Secondary | ICD-10-CM
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
## 2023-09-20 DIAGNOSIS — S60229A Contusion of unspecified hand, initial encounter: Secondary | ICD-10-CM | POA: Insufficient documentation

## 2023-09-21 ENCOUNTER — Ambulatory Visit: Payer: MEDICAID | Admitting: Occupational Therapy

## 2023-09-21 DIAGNOSIS — R208 Other disturbances of skin sensation: Secondary | ICD-10-CM

## 2023-09-21 DIAGNOSIS — R29898 Other symptoms and signs involving the musculoskeletal system: Secondary | ICD-10-CM | POA: Diagnosis not present

## 2023-09-21 DIAGNOSIS — M6281 Muscle weakness (generalized): Secondary | ICD-10-CM

## 2023-09-21 DIAGNOSIS — R278 Other lack of coordination: Secondary | ICD-10-CM

## 2023-09-21 DIAGNOSIS — R29818 Other symptoms and signs involving the nervous system: Secondary | ICD-10-CM

## 2023-09-21 DIAGNOSIS — M79641 Pain in right hand: Secondary | ICD-10-CM
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
## 2023-09-24 ENCOUNTER — Ambulatory Visit: Payer: MEDICAID | Admitting: Podiatry

## 2023-09-28 ENCOUNTER — Ambulatory Visit: Payer: MEDICAID | Admitting: Occupational Therapy

## 2023-09-28 ENCOUNTER — Ambulatory Visit: Payer: MEDICAID | Admitting: Physical Therapy

## 2023-09-28 DIAGNOSIS — M79642 Pain in left hand: Secondary | ICD-10-CM

## 2023-09-28 DIAGNOSIS — M25511 Pain in right shoulder: Secondary | ICD-10-CM

## 2023-09-28 DIAGNOSIS — R278 Other lack of coordination: Secondary | ICD-10-CM

## 2023-09-28 DIAGNOSIS — R208 Other disturbances of skin sensation: Secondary | ICD-10-CM

## 2023-09-28 DIAGNOSIS — M6281 Muscle weakness (generalized): Secondary | ICD-10-CM

## 2023-09-28 DIAGNOSIS — R29818 Other symptoms and signs involving the nervous system: Secondary | ICD-10-CM

## 2023-09-28 DIAGNOSIS — R29898 Other symptoms and signs involving the musculoskeletal system: Secondary | ICD-10-CM

## 2023-09-28 DIAGNOSIS — M542 Cervicalgia: Secondary | ICD-10-CM
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
## 2023-09-28 NOTE — Patient Instructions (Signed)
 Access Code: [email or web address] URL: [email or web address] Date: 09/28/2023 Prepared by: Sudie Ely  Exercises - Putty Squeezes  - 1-2 x daily - 10 reps - Rolling Putty on Table  - 1-2 x daily - 10 reps - Finger Pinch and Pull with Putty  - 1-2 x daily - 10 reps - 3-Point Pinch with Putty  - 1-2 x daily - 10 reps - Tip PUSH with Putty  - 1-2 x daily - 10 reps - Key Pinch with Putty  - 1-2 x daily - 10 reps - Finger Extension with Putty  - 1-2 x daily - 10 reps - Finger Adduction with Putty  - 1-2 x daily - 10 reps - Removing Marbles from Putty  - 1-2 x daily - 10 reps
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
## 2023-09-29 ENCOUNTER — Ambulatory Visit (INDEPENDENT_AMBULATORY_CARE_PROVIDER_SITE_OTHER): Payer: MEDICAID | Admitting: Podiatry

## 2023-09-29 DIAGNOSIS — L600 Ingrowing nail: Secondary | ICD-10-CM | POA: Diagnosis not present

## 2023-09-29 NOTE — Progress Notes (Signed)
 Subjective:  Patient ID: Melissa Davies, female    DOB: 04-Oct-1970,  MRN: [id number]  Chief Complaint  Patient presents with   Nail Problem    Pt stated that she just wanted to make sure that her nail is growing back okay     53 y.o. female presents with the above complaint.  Patient presents with left nail dystrophy with underlying ingrown.  Patient states that the nail grew back does not bother her wanted to get evaluation performed right.  Denies any other acute complaints.  0 out of 10 pain scale.  The ingrown does not bother her as well.   Review of Systems: Negative except as noted in the HPI. Denies N/V/F/Ch.  Past Medical History:  Diagnosis Date   ADHD    Anxiety    PTSD (post-traumatic stress disorder)     Current Outpatient Medications:    buPROPion  (WELLBUTRIN  XL) 150 MG 24 hr tablet, Take 150 mg by mouth., Disp: , Rfl:    cetirizine (ZYRTEC) 10 MG tablet, Take 10 mg by mouth., Disp: , Rfl:    gabapentin  (NEURONTIN ) 300 MG capsule, Take 300 mg by mouth., Disp: , Rfl:    ketorolac (TORADOL) 10 MG tablet, 1 tab(s) orally 4 times a day prn moderate-severe pain for 5 day(s), Disp: , Rfl:    lidocaine  (XYLOCAINE ) 1 % (with preservative) injection, 0.5 mLs by Infiltration route., Disp: , Rfl:    orphenadrine (NORFLEX) 100 MG tablet, 1 tab(s) orally 2 times a day for 15 days, Disp: , Rfl:    phentermine (ADIPEX-P) 37.5 MG tablet, , Disp: , Rfl:    triamcinolone acetonide (KENALOG-40) 40 MG/ML injection, Inject 20 mg into the articular space., Disp: , Rfl:    Aspirin-Caffeine (BC FAST PAIN RELIEF ARTHRITIS) 1000-65 MG PACK, Take 1 packet by mouth., Disp: , Rfl:    ibuprofen  (ADVIL ) 400 MG tablet, Take 400 mg by mouth., Disp: , Rfl:    loratadine (CLARITIN) 10 MG tablet, Take 10 mg by mouth., Disp: , Rfl:    meclizine  (ANTIVERT ) 25 MG tablet, Take 1 tablet (25 mg total) by mouth 3 (three) times daily as needed for dizziness. (Patient not taking: Reported on 09/15/2023), Disp:  30 tablet, Rfl: 1   meloxicam  (MOBIC ) 15 MG tablet, Take 1 tablet (15 mg total) by mouth daily., Disp: 30 tablet, Rfl: 0   methocarbamol  (ROBAXIN ) 500 MG tablet, Take 1 tablet (500 mg total) by mouth 2 (two) times daily., Disp: 20 tablet, Rfl: 0  Social History   Tobacco Use  Smoking Status Former   Types: Cigarettes  Smokeless Tobacco Never    No Known Allergies Objective:  There were no vitals filed for this visit. There is no height or weight on file to calculate BMI. Constitutional Well developed. Well nourished.  Vascular Dorsalis pedis pulses palpable bilaterally. Posterior tibial pulses palpable bilaterally. Capillary refill normal to all digits.  No cyanosis or clubbing noted. Pedal hair growth normal.  Neurologic Normal speech. Oriented to person, place, and time. Epicritic sensation to light touch grossly present bilaterally.  Dermatologic Left hallux nail dystrophy with underlying ingrown.  No pain on palpation.  Mild disc pallor noted.  Some onychomycosis noted. Skin within normal limits  Orthopedic: Normal joint ROM without pain or crepitus bilaterally. No visible deformities. No bony tenderness.   Radiographs: None Assessment:   1. Ingrown left big toenail    Plan:  Patient was evaluated and treated and all questions answered.  Left hallux nail dystrophy with  underlying ingrown - All questions and concerns were discussed with the patient in extensive detail at this time clinically does not cause her discomfort therefore no removal of nail is indicated at this time if it continues to bother her in the future she will come back and see me and we will plan on doing another total nail avulsion  No follow-ups on file.
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
## 2023-10-01 ENCOUNTER — Ambulatory Visit: Payer: MEDICAID | Admitting: Physical Therapy

## 2023-10-01 ENCOUNTER — Encounter: Payer: Self-pay | Admitting: Physician Assistant

## 2023-10-01 ENCOUNTER — Ambulatory Visit: Payer: MEDICAID | Admitting: Occupational Therapy

## 2023-10-01 DIAGNOSIS — M25511 Pain in right shoulder: Secondary | ICD-10-CM

## 2023-10-01 DIAGNOSIS — M6281 Muscle weakness (generalized): Secondary | ICD-10-CM

## 2023-10-01 DIAGNOSIS — M542 Cervicalgia: Secondary | ICD-10-CM

## 2023-10-01 DIAGNOSIS — R278 Other lack of coordination: Secondary | ICD-10-CM

## 2023-10-01 DIAGNOSIS — M79641 Pain in right hand: Secondary | ICD-10-CM

## 2023-10-01 DIAGNOSIS — R29898 Other symptoms and signs involving the musculoskeletal system: Secondary | ICD-10-CM | POA: Diagnosis not present

## 2023-10-01 DIAGNOSIS — R29818 Other symptoms and signs involving the nervous system: Secondary | ICD-10-CM

## 2023-10-01 DIAGNOSIS — R208 Other disturbances of skin sensation: Secondary | ICD-10-CM
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
## 2023-10-04 ENCOUNTER — Ambulatory Visit: Payer: MEDICAID | Admitting: Physical Therapy

## 2023-10-04 ENCOUNTER — Ambulatory Visit: Payer: MEDICAID | Admitting: Occupational Therapy

## 2023-10-04 ENCOUNTER — Encounter: Payer: Self-pay | Admitting: Physical Therapy

## 2023-10-04 DIAGNOSIS — R29898 Other symptoms and signs involving the musculoskeletal system: Secondary | ICD-10-CM

## 2023-10-04 DIAGNOSIS — R278 Other lack of coordination: Secondary | ICD-10-CM

## 2023-10-04 DIAGNOSIS — M79642 Pain in left hand: Secondary | ICD-10-CM

## 2023-10-04 DIAGNOSIS — M6281 Muscle weakness (generalized): Secondary | ICD-10-CM

## 2023-10-04 DIAGNOSIS — M542 Cervicalgia: Secondary | ICD-10-CM

## 2023-10-04 DIAGNOSIS — M25511 Pain in right shoulder: Secondary | ICD-10-CM

## 2023-10-04 DIAGNOSIS — R29818 Other symptoms and signs involving the nervous system: Secondary | ICD-10-CM

## 2023-10-04 DIAGNOSIS — R208 Other disturbances of skin sensation: Secondary | ICD-10-CM
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
## 2023-10-08 ENCOUNTER — Encounter: Payer: Self-pay | Admitting: Obstetrics and Gynecology

## 2023-10-08 ENCOUNTER — Other Ambulatory Visit (HOSPITAL_COMMUNITY)
Admission: RE | Admit: 2023-10-08 | Discharge: 2023-10-08 | Disposition: A | Discharge status: Home / Self Care | Payer: MEDICAID | Source: Ambulatory Visit | Attending: Obstetrics and Gynecology | Admitting: Obstetrics and Gynecology

## 2023-10-08 ENCOUNTER — Ambulatory Visit: Payer: MEDICAID | Admitting: Obstetrics and Gynecology

## 2023-10-08 ENCOUNTER — Ambulatory Visit: Payer: MEDICAID | Admitting: Physical Therapy

## 2023-10-08 ENCOUNTER — Ambulatory Visit: Payer: Self-pay | Admitting: Podiatry

## 2023-10-08 ENCOUNTER — Ambulatory Visit: Payer: MEDICAID | Admitting: Occupational Therapy

## 2023-10-08 VITALS — BP 136/89 | HR 70 | Ht 63.0 in | Wt 219.4 lb

## 2023-10-08 DIAGNOSIS — L292 Pruritus vulvae: Secondary | ICD-10-CM

## 2023-10-08 DIAGNOSIS — M25511 Pain in right shoulder: Secondary | ICD-10-CM

## 2023-10-08 DIAGNOSIS — M542 Cervicalgia: Secondary | ICD-10-CM

## 2023-10-08 DIAGNOSIS — M6281 Muscle weakness (generalized): Secondary | ICD-10-CM

## 2023-10-08 DIAGNOSIS — Z0000 Encounter for general adult medical examination without abnormal findings: Secondary | ICD-10-CM | POA: Insufficient documentation

## 2023-10-08 DIAGNOSIS — Z113 Encounter for screening for infections with a predominantly sexual mode of transmission: Secondary | ICD-10-CM

## 2023-10-08 DIAGNOSIS — R278 Other lack of coordination: Secondary | ICD-10-CM

## 2023-10-08 DIAGNOSIS — R29818 Other symptoms and signs involving the nervous system: Secondary | ICD-10-CM

## 2023-10-08 DIAGNOSIS — R29898 Other symptoms and signs involving the musculoskeletal system: Secondary | ICD-10-CM

## 2023-10-08 DIAGNOSIS — N958 Other specified menopausal and perimenopausal disorders: Secondary | ICD-10-CM | POA: Diagnosis not present

## 2023-10-08 DIAGNOSIS — R208 Other disturbances of skin sensation: Secondary | ICD-10-CM

## 2023-10-08 DIAGNOSIS — M79642 Pain in left hand: Secondary | ICD-10-CM

## 2023-10-08 MED ORDER — ESTRADIOL 0.1 MG/GM VA CREA: TOPICAL_CREAM | VAGINAL | 12 refills | Status: DC

## 2023-10-08 MED ORDER — CLOBETASOL PROPIONATE 0.05 % EX OINT
TOPICAL_OINTMENT | CUTANEOUS | 1 refills | Status: DC
Start: 2023-10-08 — End: 2023-10-30

## 2023-10-08 NOTE — Progress Notes (Signed)
 No concerns at this time.   Here for annual and STD screening.
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
## 2023-10-08 NOTE — Progress Notes (Signed)
 ANNUAL EXAM Patient name: Melissa Davies MRN [id number]  Date of birth: 15-Jan-1971 Chief Complaint:   Gynecologic Exam  History of Present Illness:   Melissa Davies is a 53 y.o. [phone] being seen today for a routine annual exam.  Current complaints: annual  Discussed the use of AI scribe software for clinical note transcription with the patient, who gave verbal consent to proceed.  History of Present Illness Melissa Davies is a 53 year old female who presents for an STD test and evaluation of postmenopausal symptoms.  She has been postmenopausal for almost ten years, with cessation of menstrual cycles around age 79. She is sexually active with her fianc and has a history of contracting STDs during a difficult divorce, which was her first experience with such diagnoses. She feels embarrassed about this past experience.  She experiences occasional pain with intercourse and wonders if it may be related to aging and thinning of the vaginal tissue. Additionally, she has significant pruritus in the pubic hair area, sometimes disrupting her sleep, and leading to intense scratching. She has noticed bumps on the labia and is concerned about the possibility of uterine prolapse.  She has no history of abnormal Pap smears and undergoes regular mammograms every August, with the last two results being negative. She has another mammogram scheduled for the upcoming August. No changes in her breasts or nipples have been noted.  She underwent a tubal ligation 22 years ago after the birth of her son and inquires whether this affects her hormones. She is concerned about bone health due to the loss of estrogen.    Patient's last menstrual period was 05/11/2016 (approximate).   The pregnancy intention screening data noted above was reviewed. Potential methods of contraception were discussed. The patient elected to proceed with No data recorded.   Last pap     Component Value Date/Time    DIAGPAP  05/14/2020 0956    - Negative for Intraepithelial Lesions or Malignancy (NILM)   DIAGPAP - Benign reactive/reparative changes 05/14/2020 0956   HPVHIGH Negative 05/14/2020 0956   ADEQPAP  05/14/2020 0956    Satisfactory for evaluation; transformation zone component PRESENT.    Last mammogram: last August, reports normal.      07/23/2020    9:39 AM 07/09/2020    1:39 PM 06/10/2020   10:39 AM 05/13/2020    8:33 AM 03/22/2020    7:58 AM  Depression screen PHQ 2/9  Decreased Interest       Down, Depressed, Hopeless       PHQ - 2 Score       Altered sleeping       Tired, decreased energy       Change in appetite       Feeling bad or failure about yourself        Trouble concentrating       Moving slowly or fidgety/restless       Suicidal thoughts       PHQ-9 Score       Difficult doing work/chores          Information is confidential and restricted. Go to Review Flowsheets to unlock data.        07/09/2020    1:37 PM 06/10/2020   10:44 AM 05/13/2020    8:36 AM 03/22/2020    8:00 AM  GAD 7 : Generalized Anxiety Score  Nervous, Anxious, on Edge      Control/stop worrying      Worry too much -  different things      Trouble relaxing      Restless      Easily annoyed or irritable      Afraid - awful might happen      Total GAD 7 Score      Anxiety Difficulty         Information is confidential and restricted. Go to Review Flowsheets to unlock data.     Review of Systems:   Pertinent items are noted in HPI Denies any headaches, blurred vision, fatigue, shortness of breath, chest pain, abdominal pain, abnormal vaginal discharge/itching/odor/irritation, problems with periods, bowel movements, urination, or intercourse unless otherwise stated above. Pertinent History Reviewed:  Reviewed past medical,surgical, social and family history.  Reviewed problem list, medications and allergies. Physical Assessment:   Vitals:   10/08/23 1109  BP: 136/89  Pulse: 70   Weight: 219 lb 6.4 oz (99.5 kg)  Height: 5' 3 (1.6 m)  Body mass index is 38.86 kg/m.        Physical Examination:   General appearance - well appearing, and in no distress  Mental status - alert, oriented to person, place, and time  Psych:  She has a normal mood and affect  Skin - warm and dry, normal color, no suspicious lesions noted  Chest - effort normal, all lung fields clear to auscultation bilaterally  Heart - normal rate and regular rhythm  Breasts - breasts appear normal, no suspicious masses, no skin or nipple changes or  axillary nodes  Abdomen - soft, nontender, nondistended, no masses or organomegaly  Pelvic -  VULVA: normal appearing vulva with no masses, tenderness or lesions   VAGINA: normal appearing vagina with normal color and discharge, no lesions   CERVIX: normal appearing cervix without discharge or lesions, no CMT  Thin prep pap is done with HR HPV cotesting  UTERUS: uterus is felt to be normal size, shape, consistency and nontender   ADNEXA: No adnexal masses or tenderness noted.  Extremities:  No swelling or varicosities noted  Chaperone present for exam  No results found for this or any previous visit (from the past 24 hours).    Assessment & Plan:   Assessment & Plan Vaginal Dryness Postmenopausal vaginal dryness due to decreased estrogen levels causing dyspareunia and possible vaginal atrophy. Discussed management options including lubricants, vaginal moisturizers, and vaginal estrogen, emphasizing safety and efficacy. Mentioned natural options like coconut oil and hyaluronic acid. - Recommend lubricants during intercourse. - Consider vaginal moisturizers for daily use. - Prescribe vaginal estrogen for moisture. - Discuss natural options like coconut oil and hyaluronic acid.  Vulvar Itching Chronic vulvar pruritus localized to pubic hair area with skin irritation likely due to inflammatory changes. Advised against waxing to prevent further  irritation. - Prescribe low-dose steroid cream for pruritus. - Follow up in six weeks to assess treatment efficacy. - Advise against waxing to prevent irritation.  Inclusion Cysts Occasional labial bumps identified as benign inclusion cysts, not a cause for concern.  General Health Maintenance Emphasized bone health post-menopause due to estrogen loss. Recommended calcium and vitamin D supplementation and strength training exercises. She undergoes regular mammograms, with the next scheduled for August. - Recommend calcium and vitamin D supplementation. - Encourage strength training exercises. - Ensure attendance of scheduled mammogram in August.  - Cervical cancer screening: Discussed guidelines. Pap with HPV collected - Breast Health: Encouraged self breast awareness/SBE. Discussed limits of clinical breast exam for detecting breast cancer. Discussed importance of annual MXR. Reported up  to date - Climacteric/Sexual health: Reviewed typical and atypical symptoms of menopause/peri-menopause. Discussed PMB and to call if any amount of spotting.  - Colonoscopy: Per PCP - F/U 12 months and prn    Orders Placed This Encounter  Procedures   Hepatitis B Surface AntiGEN   Hepatitis C Antibody   HIV antibody (with reflex)   RPR    Meds:  Meds ordered this encounter  Medications   clobetasol  ointment (TEMOVATE ) 0.05 %    Sig: Apply to affected area every night for 4 weeks, then every other day for 4 weeks and then twice a week for 4 weeks or until resolution.    Dispense:  30 g    Refill:  1   estradiol  (ESTRACE ) 0.1 MG/GM vaginal cream    Sig: Apply 1 gram per vagina every night for 2 weeks, then apply three times a week    Dispense:  30 g    Refill:  12    Follow-up: No follow-ups on file.  Carter Quarry, MD 10/08/2023 11:42 AM
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
## 2023-10-11 ENCOUNTER — Encounter: Payer: Self-pay | Admitting: Physical Therapy

## 2023-10-11 ENCOUNTER — Ambulatory Visit: Payer: MEDICAID | Admitting: Physical Therapy

## 2023-10-11 ENCOUNTER — Ambulatory Visit: Payer: MEDICAID | Admitting: Occupational Therapy

## 2023-10-11 DIAGNOSIS — M6281 Muscle weakness (generalized): Secondary | ICD-10-CM

## 2023-10-11 DIAGNOSIS — R208 Other disturbances of skin sensation: Secondary | ICD-10-CM

## 2023-10-11 DIAGNOSIS — M79642 Pain in left hand: Secondary | ICD-10-CM

## 2023-10-11 DIAGNOSIS — M79641 Pain in right hand: Secondary | ICD-10-CM

## 2023-10-11 DIAGNOSIS — M25511 Pain in right shoulder: Secondary | ICD-10-CM

## 2023-10-11 DIAGNOSIS — R29818 Other symptoms and signs involving the nervous system: Secondary | ICD-10-CM

## 2023-10-11 DIAGNOSIS — L905 Scar conditions and fibrosis of skin: Secondary | ICD-10-CM

## 2023-10-11 DIAGNOSIS — M542 Cervicalgia: Secondary | ICD-10-CM

## 2023-10-11 DIAGNOSIS — R29898 Other symptoms and signs involving the musculoskeletal system: Secondary | ICD-10-CM | POA: Diagnosis not present

## 2023-10-11 DIAGNOSIS — R278 Other lack of coordination: Secondary | ICD-10-CM
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
## 2023-10-11 NOTE — Patient Instructions (Signed)
 SABRA
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
## 2023-10-12 ENCOUNTER — Ambulatory Visit: Payer: Self-pay | Admitting: Obstetrics and Gynecology

## 2023-10-12 LAB — CYTOLOGY - PAP
Comment: NEGATIVE
Diagnosis: UNDETERMINED — AB
High risk HPV: NEGATIVE

## 2023-10-12 LAB — CERVICOVAGINAL ANCILLARY ONLY
Bacterial Vaginitis (gardnerella): NEGATIVE
Candida Glabrata: NEGATIVE
Candida Vaginitis: NEGATIVE
Chlamydia: NEGATIVE
Comment: NEGATIVE
Comment: NEGATIVE
Comment: NEGATIVE
Comment: NEGATIVE
Comment: NEGATIVE
Comment: NORMAL
Neisseria Gonorrhea: NEGATIVE
Trichomonas: NEGATIVE

## 2023-10-14 ENCOUNTER — Ambulatory Visit: Payer: MEDICAID | Attending: Nurse Practitioner | Admitting: Occupational Therapy

## 2023-10-14 DIAGNOSIS — R208 Other disturbances of skin sensation: Secondary | ICD-10-CM | POA: Insufficient documentation

## 2023-10-14 DIAGNOSIS — R29818 Other symptoms and signs involving the nervous system: Secondary | ICD-10-CM | POA: Diagnosis present

## 2023-10-14 DIAGNOSIS — M6281 Muscle weakness (generalized): Secondary | ICD-10-CM | POA: Insufficient documentation

## 2023-10-14 DIAGNOSIS — M542 Cervicalgia: Secondary | ICD-10-CM | POA: Diagnosis present

## 2023-10-14 DIAGNOSIS — M25511 Pain in right shoulder: Secondary | ICD-10-CM | POA: Diagnosis present

## 2023-10-14 DIAGNOSIS — R278 Other lack of coordination: Secondary | ICD-10-CM | POA: Insufficient documentation

## 2023-10-14 DIAGNOSIS — R29898 Other symptoms and signs involving the musculoskeletal system: Secondary | ICD-10-CM | POA: Diagnosis present

## 2023-10-14 DIAGNOSIS — L905 Scar conditions and fibrosis of skin: Secondary | ICD-10-CM | POA: Diagnosis present

## 2023-10-14 DIAGNOSIS — M25512 Pain in left shoulder: Secondary | ICD-10-CM | POA: Diagnosis present

## 2023-10-14 DIAGNOSIS — M79641 Pain in right hand: Secondary | ICD-10-CM | POA: Insufficient documentation

## 2023-10-14 DIAGNOSIS — M79642 Pain in left hand: Secondary | ICD-10-CM | POA: Insufficient documentation
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
## 2023-10-18 ENCOUNTER — Ambulatory Visit: Payer: MEDICAID | Admitting: Physical Therapy

## 2023-10-18 ENCOUNTER — Ambulatory Visit: Payer: MEDICAID | Admitting: Occupational Therapy

## 2023-10-18 ENCOUNTER — Encounter: Payer: Self-pay | Admitting: Physical Therapy

## 2023-10-18 VITALS — BP 111/53 | HR 99

## 2023-10-18 DIAGNOSIS — M79641 Pain in right hand: Secondary | ICD-10-CM

## 2023-10-18 DIAGNOSIS — R278 Other lack of coordination: Secondary | ICD-10-CM

## 2023-10-18 DIAGNOSIS — M6281 Muscle weakness (generalized): Secondary | ICD-10-CM

## 2023-10-18 DIAGNOSIS — R29818 Other symptoms and signs involving the nervous system: Secondary | ICD-10-CM

## 2023-10-18 DIAGNOSIS — M542 Cervicalgia: Secondary | ICD-10-CM

## 2023-10-18 DIAGNOSIS — R208 Other disturbances of skin sensation: Secondary | ICD-10-CM

## 2023-10-18 DIAGNOSIS — M79642 Pain in left hand: Secondary | ICD-10-CM

## 2023-10-18 NOTE — Patient Instructions (Signed)
 Access Code: [email or web address] URL: [email or web address] Date: 10/01/2023 Prepared by: Waddell Southgate  Exercises - Seated Cervical Flexion AROM  - 1 x daily - 7 x weekly - 1 sets - 3-5 reps - 30 sec hold - Seated Cervical Sidebending AROM  - 1 x daily - 7 x weekly - 1 sets - 3-5 reps - 30 sec hold - Seated Cervical Rotation AROM  - 1 x daily - 7 x weekly - 1 sets - 3-5 reps - 30 sec hold - Single Arm Doorway Pec Stretch at 90 Degrees Abduction  - 1 x daily - 7 x weekly - 1 sets - 2-3 reps - 30 seconds hold - Seated Chin Tuck with Neck Elongation  - 1 x daily - 4 x weekly - 2 sets - 10 reps - Seated Scapular Retraction  - 1 x daily - 4 x weekly - 1-2 sets - 10-12 reps - Seated Cervical Traction  - 1 x daily - 4 x weekly - 1 sets - 3-4 reps - 15-20 seconds hold
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
## 2023-10-20 ENCOUNTER — Ambulatory Visit: Payer: MEDICAID | Admitting: Occupational Therapy

## 2023-10-20 ENCOUNTER — Ambulatory Visit: Payer: MEDICAID | Admitting: Physical Therapy

## 2023-10-20 DIAGNOSIS — M6281 Muscle weakness (generalized): Secondary | ICD-10-CM

## 2023-10-20 DIAGNOSIS — L905 Scar conditions and fibrosis of skin: Secondary | ICD-10-CM

## 2023-10-20 DIAGNOSIS — R208 Other disturbances of skin sensation: Secondary | ICD-10-CM

## 2023-10-20 DIAGNOSIS — R278 Other lack of coordination: Secondary | ICD-10-CM | POA: Diagnosis not present

## 2023-10-20 DIAGNOSIS — R29898 Other symptoms and signs involving the musculoskeletal system: Secondary | ICD-10-CM

## 2023-10-20 DIAGNOSIS — M79641 Pain in right hand: Secondary | ICD-10-CM

## 2023-10-20 DIAGNOSIS — R29818 Other symptoms and signs involving the nervous system: Secondary | ICD-10-CM

## 2023-10-20 DIAGNOSIS — M25511 Pain in right shoulder: Secondary | ICD-10-CM

## 2023-10-20 DIAGNOSIS — M542 Cervicalgia: Secondary | ICD-10-CM

## 2023-10-20 DIAGNOSIS — M79642 Pain in left hand: Secondary | ICD-10-CM
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
## 2023-10-20 NOTE — Patient Instructions (Signed)
 Stress Reduction Techniques Laying down/resting Talking it out/writing Praying; listening to bible Listen to music   Watch birds send letters/cards to family/friends Smelling lavender or peppermint, using scented lotion Deep breathing - can use YouTube videos as well Count breaths in (through the nose) and out (through the mouth) 4(in)-7(hold)-8(out) breathing Diaphragmatic breathing Guided imagery -think about being in a certain place (YouTube) Meditation Progressive Muscle Relaxation (YouTube) Sitting on the deck outside, enjoying the weather when it is nice Spending time with your dog Spending time with family Stay in contact with physicians to monitor YouTube [employer] [facility] - progressive muscle relaxation [email or web address]   [employer] [facility] - deep breathing [email or web address] Diaphragmatic breathing "belly breathing" 4-7-8 breathing (in-hold-out)   [facility] - guided imagery [email or web address]

## 2023-10-25 ENCOUNTER — Ambulatory Visit: Payer: MEDICAID | Admitting: Occupational Therapy

## 2023-10-25 DIAGNOSIS — R208 Other disturbances of skin sensation: Secondary | ICD-10-CM

## 2023-10-25 DIAGNOSIS — L905 Scar conditions and fibrosis of skin: Secondary | ICD-10-CM

## 2023-10-25 DIAGNOSIS — M6281 Muscle weakness (generalized): Secondary | ICD-10-CM

## 2023-10-25 DIAGNOSIS — R278 Other lack of coordination: Secondary | ICD-10-CM

## 2023-10-25 DIAGNOSIS — M79642 Pain in left hand: Secondary | ICD-10-CM

## 2023-10-25 DIAGNOSIS — M79641 Pain in right hand: Secondary | ICD-10-CM

## 2023-10-25 DIAGNOSIS — R29818 Other symptoms and signs involving the nervous system: Secondary | ICD-10-CM

## 2023-10-25 DIAGNOSIS — R29898 Other symptoms and signs involving the musculoskeletal system: Secondary | ICD-10-CM
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
## 2023-10-27 ENCOUNTER — Ambulatory Visit: Payer: MEDICAID | Admitting: Occupational Therapy

## 2023-10-27 DIAGNOSIS — R29898 Other symptoms and signs involving the musculoskeletal system: Secondary | ICD-10-CM

## 2023-10-27 DIAGNOSIS — M79641 Pain in right hand: Secondary | ICD-10-CM

## 2023-10-27 DIAGNOSIS — R278 Other lack of coordination: Secondary | ICD-10-CM | POA: Diagnosis not present

## 2023-10-27 DIAGNOSIS — M79642 Pain in left hand: Secondary | ICD-10-CM

## 2023-10-27 DIAGNOSIS — L905 Scar conditions and fibrosis of skin: Secondary | ICD-10-CM

## 2023-10-27 DIAGNOSIS — R208 Other disturbances of skin sensation: Secondary | ICD-10-CM

## 2023-10-27 DIAGNOSIS — M6281 Muscle weakness (generalized): Secondary | ICD-10-CM

## 2023-10-27 DIAGNOSIS — R29818 Other symptoms and signs involving the nervous system: Secondary | ICD-10-CM
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
## 2023-10-27 NOTE — Addendum Note (Signed)
 Addended by: MANNY JOCELYN HERO on: 10/27/2023 05:23 PM   Modules accepted: Orders

## 2023-10-27 NOTE — Patient Instructions (Signed)
??   Sleep Hygiene Handout ?? What Is Sleep Hygiene? Sleep hygiene refers to healthy habits and practices that help improve the quality, duration, and consistency of your sleep. Good sleep hygiene supports mental, emotional, and physical health.  ? Tips for Better Sleep Hygiene 1. Stick to a Consistent Sleep Schedule Go to bed and wake up at the same time every day--even on weekends. Helps regulate your body's internal clock. 2. Create a Relaxing Bedtime Routine Try calming activities before bed (e.g., reading, gentle stretching, meditation). Avoid stressful conversations or work tasks right before sleep. Take a bath or shower. 3. Limit Exposure to Light at Night Dim the lights in the evening. Avoid screens (phones, TVs, computers) at least 1 hour before bed. Use blue light filters if necessary. 4. Make Your Sleep Environment Comfortable Keep your bedroom dark, quiet, and cool (60-3F / 15-19C is ideal). Use blackout curtains, white noise machines, or earplugs if needed. Invest in a comfortable mattress and pillows. Change your bedsheets and make your bed Practice proper sleep positioning Incorporate smells that you enjoy/help you relax like lavender, peppermint, etc. (lotion, bath/beauty products, essential oils/diffuser) 5. Watch What You Eat & Drink Avoid large meals, caffeine, and alcohol close to bedtime. Try to finish eating at least 2-3 hours before bed.   6. Get Regular Physical Activity Exercise during the day can help you fall asleep faster and enjoy deeper sleep. (complete therapy exercises, walking, go to the gym) Avoid intense workouts late in the evening. 7. Limit Naps Keep naps short (20-30 minutes). Avoid napping late in the afternoon or evening.  ?? Habits That Interfere with Sleep Using your bed for work, watching TV, or eating. Staying in bed when you can't fall asleep (if you're awake for 20+ minutes, get up and do something relaxing in dim light).  ????  Bonus Tips for Relaxation Try breathing exercises, progressive muscle relaxation, or guided meditation. Apps like Calm, Headspace, or Insight Timer can be helpful.  ?? When to Seek Help Consider talking to a doctor or sleep specialist if: You regularly have trouble falling or staying asleep. You snore loudly or gasp for air during sleep. You feel very sleepy during the day despite adequate sleep.

## 2023-10-29 ENCOUNTER — Other Ambulatory Visit: Payer: Self-pay | Admitting: Obstetrics and Gynecology

## 2023-10-29 DIAGNOSIS — N958 Other specified menopausal and perimenopausal disorders: Secondary | ICD-10-CM

## 2023-10-29 MED ORDER — PREMARIN 0.625 MG/GM VA CREA: 1.0000 | TOPICAL_CREAM | VAGINAL | 12 refills | Status: DC

## 2023-10-30 ENCOUNTER — Encounter (HOSPITAL_COMMUNITY): Payer: Self-pay

## 2023-10-30 ENCOUNTER — Ambulatory Visit (HOSPITAL_COMMUNITY)
Admission: RE | Admit: 2023-10-30 | Discharge: 2023-10-30 | Disposition: A | Discharge status: Home / Self Care | Payer: MEDICAID | Source: Ambulatory Visit

## 2023-10-30 VITALS — BP 98/66 | HR 66 | Temp 98.3°F | Resp 16

## 2023-10-30 DIAGNOSIS — F419 Anxiety disorder, unspecified: Secondary | ICD-10-CM | POA: Diagnosis not present

## 2023-10-30 DIAGNOSIS — M542 Cervicalgia: Secondary | ICD-10-CM

## 2023-10-30 DIAGNOSIS — M5030 Other cervical disc degeneration, unspecified cervical region: Secondary | ICD-10-CM

## 2023-10-30 MED ORDER — KETOROLAC TROMETHAMINE 30 MG/ML IJ SOLN
30.0000 mg | Freq: Once | INTRAMUSCULAR | Status: AC
  Administered 2023-10-30: 30 mg via INTRAMUSCULAR

## 2023-10-30 MED ORDER — KETOROLAC TROMETHAMINE 30 MG/ML IJ SOLN
INTRAMUSCULAR | Status: AC
  Filled 2023-10-30: qty 1
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
## 2023-10-30 NOTE — Discharge Instructions (Addendum)
 Thank you for letting me be a part of your care today.  Please continue to advocate for your health.  Please follow-up with an orthopedic for further advanced evaluation of your neck pain and degenerative disc disease. The Toradol  should kick in within the next 30 minutes to give you some relief.  For any breakthrough pain you can take Tylenol  500 mg every 8 hours as needed.  Seek immediate care at the nearest emergency department for any worsening pain, weakness, or new concerning symptoms.   Do not hesitate to seek immediate care at the behavioral health urgent care, they are open 24/7 365.

## 2023-11-01 ENCOUNTER — Ambulatory Visit: Payer: MEDICAID | Admitting: Occupational Therapy

## 2023-11-01 ENCOUNTER — Ambulatory Visit: Payer: MEDICAID | Admitting: Physical Therapy

## 2023-11-01 DIAGNOSIS — R29898 Other symptoms and signs involving the musculoskeletal system: Secondary | ICD-10-CM

## 2023-11-01 DIAGNOSIS — M79641 Pain in right hand: Secondary | ICD-10-CM

## 2023-11-01 DIAGNOSIS — M6281 Muscle weakness (generalized): Secondary | ICD-10-CM

## 2023-11-01 DIAGNOSIS — R29818 Other symptoms and signs involving the nervous system: Secondary | ICD-10-CM

## 2023-11-01 DIAGNOSIS — L905 Scar conditions and fibrosis of skin: Secondary | ICD-10-CM

## 2023-11-01 DIAGNOSIS — M542 Cervicalgia: Secondary | ICD-10-CM

## 2023-11-01 DIAGNOSIS — M79642 Pain in left hand: Secondary | ICD-10-CM

## 2023-11-01 DIAGNOSIS — R208 Other disturbances of skin sensation: Secondary | ICD-10-CM

## 2023-11-01 DIAGNOSIS — R278 Other lack of coordination: Secondary | ICD-10-CM

## 2023-11-01 DIAGNOSIS — M25511 Pain in right shoulder: Secondary | ICD-10-CM
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
## 2023-11-08 ENCOUNTER — Ambulatory Visit: Payer: MEDICAID | Admitting: Occupational Therapy

## 2023-11-08 DIAGNOSIS — M6281 Muscle weakness (generalized): Secondary | ICD-10-CM

## 2023-11-08 DIAGNOSIS — R278 Other lack of coordination: Secondary | ICD-10-CM | POA: Diagnosis not present

## 2023-11-08 DIAGNOSIS — L905 Scar conditions and fibrosis of skin: Secondary | ICD-10-CM

## 2023-11-08 DIAGNOSIS — M79642 Pain in left hand: Secondary | ICD-10-CM

## 2023-11-08 DIAGNOSIS — R208 Other disturbances of skin sensation: Secondary | ICD-10-CM

## 2023-11-08 DIAGNOSIS — M79641 Pain in right hand: Secondary | ICD-10-CM

## 2023-11-08 DIAGNOSIS — R29898 Other symptoms and signs involving the musculoskeletal system: Secondary | ICD-10-CM

## 2023-11-08 DIAGNOSIS — R29818 Other symptoms and signs involving the nervous system: Secondary | ICD-10-CM
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
## 2023-11-10 ENCOUNTER — Encounter: Payer: Self-pay | Admitting: Occupational Therapy

## 2023-11-10 ENCOUNTER — Ambulatory Visit (HOSPITAL_COMMUNITY): Payer: Self-pay

## 2023-11-10 ENCOUNTER — Ambulatory Visit: Payer: MEDICAID | Admitting: Occupational Therapy

## 2023-11-10 DIAGNOSIS — M6281 Muscle weakness (generalized): Secondary | ICD-10-CM

## 2023-11-10 DIAGNOSIS — R208 Other disturbances of skin sensation: Secondary | ICD-10-CM

## 2023-11-10 DIAGNOSIS — R29898 Other symptoms and signs involving the musculoskeletal system: Secondary | ICD-10-CM

## 2023-11-10 DIAGNOSIS — M79641 Pain in right hand: Secondary | ICD-10-CM

## 2023-11-10 DIAGNOSIS — R278 Other lack of coordination: Secondary | ICD-10-CM | POA: Diagnosis not present

## 2023-11-10 DIAGNOSIS — M79642 Pain in left hand: Secondary | ICD-10-CM
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
## 2023-11-11 ENCOUNTER — Ambulatory Visit: Payer: MEDICAID | Admitting: Physical Therapy

## 2023-11-15 ENCOUNTER — Ambulatory Visit: Payer: MEDICAID | Attending: Nurse Practitioner | Admitting: Occupational Therapy

## 2023-11-15 DIAGNOSIS — M542 Cervicalgia: Secondary | ICD-10-CM | POA: Diagnosis present

## 2023-11-15 DIAGNOSIS — M25511 Pain in right shoulder: Secondary | ICD-10-CM | POA: Insufficient documentation

## 2023-11-15 DIAGNOSIS — L905 Scar conditions and fibrosis of skin: Secondary | ICD-10-CM | POA: Diagnosis present

## 2023-11-15 DIAGNOSIS — M79641 Pain in right hand: Secondary | ICD-10-CM | POA: Insufficient documentation

## 2023-11-15 DIAGNOSIS — R278 Other lack of coordination: Secondary | ICD-10-CM | POA: Diagnosis present

## 2023-11-15 DIAGNOSIS — M25512 Pain in left shoulder: Secondary | ICD-10-CM | POA: Diagnosis present

## 2023-11-15 DIAGNOSIS — R208 Other disturbances of skin sensation: Secondary | ICD-10-CM | POA: Insufficient documentation

## 2023-11-15 DIAGNOSIS — M6281 Muscle weakness (generalized): Secondary | ICD-10-CM | POA: Diagnosis present

## 2023-11-15 DIAGNOSIS — R29818 Other symptoms and signs involving the nervous system: Secondary | ICD-10-CM | POA: Diagnosis present

## 2023-11-15 DIAGNOSIS — R29898 Other symptoms and signs involving the musculoskeletal system: Secondary | ICD-10-CM | POA: Insufficient documentation

## 2023-11-15 DIAGNOSIS — M79642 Pain in left hand: Secondary | ICD-10-CM | POA: Insufficient documentation
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
## 2023-11-18 ENCOUNTER — Encounter: Payer: Self-pay | Admitting: Physical Therapy

## 2023-11-18 ENCOUNTER — Ambulatory Visit: Payer: MEDICAID | Admitting: Physical Therapy

## 2023-11-18 DIAGNOSIS — R208 Other disturbances of skin sensation: Secondary | ICD-10-CM

## 2023-11-18 DIAGNOSIS — M79641 Pain in right hand: Secondary | ICD-10-CM | POA: Diagnosis not present

## 2023-11-18 DIAGNOSIS — R278 Other lack of coordination: Secondary | ICD-10-CM

## 2023-11-18 DIAGNOSIS — M542 Cervicalgia: Secondary | ICD-10-CM

## 2023-11-18 DIAGNOSIS — R29898 Other symptoms and signs involving the musculoskeletal system: Secondary | ICD-10-CM

## 2023-11-18 DIAGNOSIS — M25511 Pain in right shoulder: Secondary | ICD-10-CM

## 2023-11-18 DIAGNOSIS — M6281 Muscle weakness (generalized): Secondary | ICD-10-CM

## 2023-11-18 DIAGNOSIS — R29818 Other symptoms and signs involving the nervous system: Secondary | ICD-10-CM
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
## 2023-11-19 ENCOUNTER — Ambulatory Visit: Payer: MEDICAID | Admitting: Occupational Therapy

## 2023-11-19 DIAGNOSIS — R208 Other disturbances of skin sensation: Secondary | ICD-10-CM

## 2023-11-19 DIAGNOSIS — M6281 Muscle weakness (generalized): Secondary | ICD-10-CM

## 2023-11-19 DIAGNOSIS — M79642 Pain in left hand: Secondary | ICD-10-CM

## 2023-11-19 DIAGNOSIS — R29898 Other symptoms and signs involving the musculoskeletal system: Secondary | ICD-10-CM

## 2023-11-19 DIAGNOSIS — L905 Scar conditions and fibrosis of skin: Secondary | ICD-10-CM

## 2023-11-19 DIAGNOSIS — R29818 Other symptoms and signs involving the nervous system: Secondary | ICD-10-CM

## 2023-11-19 DIAGNOSIS — R278 Other lack of coordination: Secondary | ICD-10-CM

## 2023-11-19 DIAGNOSIS — M79641 Pain in right hand: Secondary | ICD-10-CM | POA: Diagnosis not present

## 2023-11-19 NOTE — Telephone Encounter (Signed)
 Pt is asking for a detailed letter stating her disability to continue getting foot stamps. Pt would like to pick up the letter when it is ready. She can be reached at [phone]
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
## 2023-11-19 NOTE — Telephone Encounter (Signed)
 Pt stated disability based on her OT ratings through [facility] OT dept notes in chart. She is also waiting on MRI results of neck. Pt advised will need to wait on provider to review and approve or not.

## 2023-11-22 ENCOUNTER — Ambulatory Visit: Payer: MEDICAID | Admitting: Occupational Therapy

## 2023-11-22 DIAGNOSIS — M79641 Pain in right hand: Secondary | ICD-10-CM | POA: Diagnosis not present

## 2023-11-22 DIAGNOSIS — R278 Other lack of coordination: Secondary | ICD-10-CM

## 2023-11-22 DIAGNOSIS — R29898 Other symptoms and signs involving the musculoskeletal system: Secondary | ICD-10-CM

## 2023-11-22 DIAGNOSIS — M79642 Pain in left hand: Secondary | ICD-10-CM

## 2023-11-22 DIAGNOSIS — M6281 Muscle weakness (generalized): Secondary | ICD-10-CM

## 2023-11-22 DIAGNOSIS — R29818 Other symptoms and signs involving the nervous system: Secondary | ICD-10-CM

## 2023-11-22 DIAGNOSIS — L905 Scar conditions and fibrosis of skin: Secondary | ICD-10-CM

## 2023-11-22 DIAGNOSIS — R208 Other disturbances of skin sensation: Secondary | ICD-10-CM
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
## 2023-11-24 ENCOUNTER — Ambulatory Visit: Payer: MEDICAID | Admitting: Occupational Therapy

## 2023-11-24 DIAGNOSIS — M79641 Pain in right hand: Secondary | ICD-10-CM | POA: Diagnosis not present

## 2023-11-24 DIAGNOSIS — R29818 Other symptoms and signs involving the nervous system: Secondary | ICD-10-CM

## 2023-11-24 DIAGNOSIS — R278 Other lack of coordination: Secondary | ICD-10-CM

## 2023-11-24 DIAGNOSIS — M6281 Muscle weakness (generalized): Secondary | ICD-10-CM

## 2023-11-24 DIAGNOSIS — R208 Other disturbances of skin sensation: Secondary | ICD-10-CM
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
## 2023-11-25 ENCOUNTER — Encounter: Payer: Self-pay | Admitting: Physical Therapy

## 2023-11-25 ENCOUNTER — Ambulatory Visit (INDEPENDENT_AMBULATORY_CARE_PROVIDER_SITE_OTHER): Payer: MEDICAID | Admitting: Physician Assistant

## 2023-11-25 ENCOUNTER — Encounter: Payer: Self-pay | Admitting: Physician Assistant

## 2023-11-25 ENCOUNTER — Ambulatory Visit: Payer: MEDICAID | Admitting: Physical Therapy

## 2023-11-25 VITALS — BP 136/78 | HR 100 | Ht 60.75 in | Wt 214.5 lb

## 2023-11-25 DIAGNOSIS — M542 Cervicalgia: Secondary | ICD-10-CM

## 2023-11-25 DIAGNOSIS — R208 Other disturbances of skin sensation: Secondary | ICD-10-CM

## 2023-11-25 DIAGNOSIS — K219 Gastro-esophageal reflux disease without esophagitis: Secondary | ICD-10-CM

## 2023-11-25 DIAGNOSIS — Z1211 Encounter for screening for malignant neoplasm of colon: Secondary | ICD-10-CM

## 2023-11-25 DIAGNOSIS — K5900 Constipation, unspecified: Secondary | ICD-10-CM

## 2023-11-25 DIAGNOSIS — R278 Other lack of coordination: Secondary | ICD-10-CM

## 2023-11-25 DIAGNOSIS — M25511 Pain in right shoulder: Secondary | ICD-10-CM

## 2023-11-25 DIAGNOSIS — M6281 Muscle weakness (generalized): Secondary | ICD-10-CM

## 2023-11-25 DIAGNOSIS — M79641 Pain in right hand: Secondary | ICD-10-CM | POA: Diagnosis not present

## 2023-11-25 DIAGNOSIS — R1310 Dysphagia, unspecified: Secondary | ICD-10-CM | POA: Diagnosis not present

## 2023-11-25 DIAGNOSIS — R29818 Other symptoms and signs involving the nervous system: Secondary | ICD-10-CM

## 2023-11-25 DIAGNOSIS — R29898 Other symptoms and signs involving the musculoskeletal system: Secondary | ICD-10-CM

## 2023-11-25 MED ORDER — PANTOPRAZOLE SODIUM 40 MG PO TBEC: 40.0000 mg | DELAYED_RELEASE_TABLET | Freq: Every day | ORAL | 3 refills | Status: AC

## 2023-11-25 MED ORDER — NA SULFATE-K SULFATE-MG SULF 17.5-3.13-1.6 GM/177ML PO SOLN: 1.0000 | Freq: Once | ORAL | 0 refills | Status: AC

## 2023-11-25 MED ORDER — SENNOSIDES-DOCUSATE SODIUM 8.6-50 MG PO TABS: 1.0000 | ORAL_TABLET | Freq: Two times a day (BID) | ORAL | 5 refills | Status: AC
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
## 2023-11-25 NOTE — Patient Instructions (Addendum)
 We have sent the following medications to your pharmacy for you to pick up at your convenience: Pantoprazole  40mg  once daily and Senkot 50 mg twice daily   You have been scheduled for an Endoscopy and Colonoscopy. Please follow the written instructions given to you at your visit today.  If you use inhalers (even only as needed), please bring them with you on the day of your procedure.  DO NOT TAKE 7 DAYS PRIOR TO TEST- Trulicity (dulaglutide) Ozempic, Wegovy (semaglutide) Mounjaro (tirzepatide) Bydureon Bcise (exanatide extended release)  DO NOT TAKE 1 DAY PRIOR TO YOUR TEST Rybelsus (semaglutide) Adlyxin (lixisenatide) Victoza (liraglutide) Byetta (exanatide) ___________________________________________________________________________  Please follow up sooner if symptoms increase or worsen __________________________________________________________________________  Due to recent changes in healthcare laws, you may see the results of your imaging and laboratory studies on MyChart before your provider has had a chance to review them.  We understand that in some cases there may be results that are confusing or concerning to you. Not all laboratory results come back in the same time frame and the provider may be waiting for multiple results in order to interpret others.  Please give us  48 hours in order for your provider to thoroughly review all the results before contacting the office for clarification of your results.   Thank you for trusting me with your gastrointestinal care!   Ellouise Console, PA-C _______________________________________________________  If your blood pressure at your visit was 140/90 or greater, please contact your primary care physician to follow up on this.  _______________________________________________________  If you are age 42 or older, your body mass index should be between 23-30. Your Body mass index is 40.86 kg/m. If this is out of the aforementioned range  listed, please consider follow up with your Primary Care Provider.  If you are age 51 or younger, your body mass index should be between 19-25. Your Body mass index is 40.86 kg/m. If this is out of the aformentioned range listed, please consider follow up with your Primary Care Provider.   ________________________________________________________  The [place] [facility] providers would like to encourage you to use MYCHART to communicate with providers for non-urgent requests or questions.  Due to long hold times on the telephone, sending your provider a message by [facility] may be a faster and more efficient way to get a response.  Please allow 48 business hours for a response.  Please remember that this is for non-urgent requests.  _______________________________________________________

## 2023-11-29 ENCOUNTER — Ambulatory Visit: Payer: MEDICAID | Admitting: Occupational Therapy

## 2023-11-29 DIAGNOSIS — R208 Other disturbances of skin sensation: Secondary | ICD-10-CM

## 2023-11-29 DIAGNOSIS — M6281 Muscle weakness (generalized): Secondary | ICD-10-CM

## 2023-11-29 DIAGNOSIS — R29818 Other symptoms and signs involving the nervous system: Secondary | ICD-10-CM

## 2023-11-29 DIAGNOSIS — M79641 Pain in right hand: Secondary | ICD-10-CM | POA: Diagnosis not present

## 2023-11-29 DIAGNOSIS — R29898 Other symptoms and signs involving the musculoskeletal system: Secondary | ICD-10-CM

## 2023-11-29 DIAGNOSIS — R278 Other lack of coordination: Secondary | ICD-10-CM
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
## 2023-12-01 ENCOUNTER — Ambulatory Visit: Payer: MEDICAID | Admitting: Occupational Therapy

## 2023-12-01 ENCOUNTER — Ambulatory Visit: Payer: MEDICAID | Admitting: Physical Therapy

## 2023-12-01 ENCOUNTER — Encounter: Payer: Self-pay | Admitting: Physical Therapy

## 2023-12-01 VITALS — BP 99/76 | HR 75

## 2023-12-01 DIAGNOSIS — R29818 Other symptoms and signs involving the nervous system: Secondary | ICD-10-CM

## 2023-12-01 DIAGNOSIS — L905 Scar conditions and fibrosis of skin: Secondary | ICD-10-CM

## 2023-12-01 DIAGNOSIS — M6281 Muscle weakness (generalized): Secondary | ICD-10-CM

## 2023-12-01 DIAGNOSIS — R208 Other disturbances of skin sensation: Secondary | ICD-10-CM

## 2023-12-01 DIAGNOSIS — R29898 Other symptoms and signs involving the musculoskeletal system: Secondary | ICD-10-CM

## 2023-12-01 DIAGNOSIS — R278 Other lack of coordination: Secondary | ICD-10-CM

## 2023-12-01 DIAGNOSIS — M79641 Pain in right hand: Secondary | ICD-10-CM | POA: Diagnosis not present

## 2023-12-01 DIAGNOSIS — M79642 Pain in left hand: Secondary | ICD-10-CM

## 2023-12-01 DIAGNOSIS — M542 Cervicalgia: Secondary | ICD-10-CM
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
## 2023-12-02 ENCOUNTER — Ambulatory Visit: Payer: Self-pay | Admitting: Physical Therapy

## 2023-12-03 NOTE — Progress Notes (Signed)
 Agree with the assessment and plan as outlined by Brigitte Canard, PA-C.

## 2023-12-06 ENCOUNTER — Ambulatory Visit: Payer: MEDICAID | Admitting: Occupational Therapy

## 2023-12-06 DIAGNOSIS — M542 Cervicalgia: Secondary | ICD-10-CM

## 2023-12-06 DIAGNOSIS — R208 Other disturbances of skin sensation: Secondary | ICD-10-CM

## 2023-12-06 DIAGNOSIS — R278 Other lack of coordination: Secondary | ICD-10-CM

## 2023-12-06 DIAGNOSIS — M79641 Pain in right hand: Secondary | ICD-10-CM

## 2023-12-06 DIAGNOSIS — R29818 Other symptoms and signs involving the nervous system: Secondary | ICD-10-CM

## 2023-12-06 DIAGNOSIS — M6281 Muscle weakness (generalized): Secondary | ICD-10-CM

## 2023-12-06 DIAGNOSIS — L905 Scar conditions and fibrosis of skin: Secondary | ICD-10-CM

## 2023-12-06 DIAGNOSIS — R29898 Other symptoms and signs involving the musculoskeletal system: Secondary | ICD-10-CM

## 2023-12-06 DIAGNOSIS — M79642 Pain in left hand: Secondary | ICD-10-CM
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
## 2023-12-07 ENCOUNTER — Encounter: Payer: Self-pay | Admitting: Gastroenterology

## 2023-12-07 ENCOUNTER — Ambulatory Visit: Payer: MEDICAID | Admitting: Gastroenterology

## 2023-12-07 VITALS — BP 140/67 | HR 64 | Temp 97.2°F | Resp 19 | Ht 60.75 in | Wt 214.0 lb

## 2023-12-07 DIAGNOSIS — K2950 Unspecified chronic gastritis without bleeding: Secondary | ICD-10-CM

## 2023-12-07 DIAGNOSIS — A048 Other specified bacterial intestinal infections: Secondary | ICD-10-CM

## 2023-12-07 DIAGNOSIS — B9681 Helicobacter pylori [H. pylori] as the cause of diseases classified elsewhere: Secondary | ICD-10-CM

## 2023-12-07 DIAGNOSIS — D120 Benign neoplasm of cecum: Secondary | ICD-10-CM | POA: Diagnosis not present

## 2023-12-07 DIAGNOSIS — K3189 Other diseases of stomach and duodenum: Secondary | ICD-10-CM

## 2023-12-07 DIAGNOSIS — K2980 Duodenitis without bleeding: Secondary | ICD-10-CM | POA: Diagnosis not present

## 2023-12-07 DIAGNOSIS — R1310 Dysphagia, unspecified: Secondary | ICD-10-CM

## 2023-12-07 DIAGNOSIS — K219 Gastro-esophageal reflux disease without esophagitis: Secondary | ICD-10-CM

## 2023-12-07 DIAGNOSIS — Z1211 Encounter for screening for malignant neoplasm of colon: Secondary | ICD-10-CM | POA: Diagnosis not present

## 2023-12-07 DIAGNOSIS — D123 Benign neoplasm of transverse colon: Secondary | ICD-10-CM

## 2023-12-07 MED ORDER — SODIUM CHLORIDE 0.9 % IV SOLN: 500.0000 mL | Freq: Once | INTRAVENOUS | Status: AC

## 2023-12-07 NOTE — Op Note (Signed)
 [facility] Patient Name: Melissa Davies Procedure Date: 12/07/2023 9:57 AM MRN: [id number] Endoscopist: Glendia E. Stacia , MD, [id number] Age: 53 Referring MD:  Date of Birth: 02/01/1971 Gender: Female Account #: [id number] Procedure:                Upper GI endoscopy Indications:              Dysphagia, Heartburn Medicines:                Monitored Anesthesia Care Procedure:                Pre-Anesthesia Assessment:                           - Prior to the procedure, a History and Physical                            was performed, and patient medications and                            allergies were reviewed. The patient's tolerance of                            previous anesthesia was also reviewed. The risks                            and benefits of the procedure and the sedation                            options and risks were discussed with the patient.                            All questions were answered, and informed consent                            was obtained. Prior Anticoagulants: The patient has                            taken no anticoagulant or antiplatelet agents. ASA                            Grade Assessment: II - A patient with mild systemic                            disease. After reviewing the risks and benefits,                            the patient was deemed in satisfactory condition to                            undergo the procedure.                           After obtaining informed consent, the endoscope was  passed under direct vision. Throughout the                            procedure, the patient's blood pressure, pulse, and                            oxygen saturations were monitored continuously. The                            Olympus Scope SN [id number] was introduced through the                            mouth, and advanced to the second part of duodenum.                            The upper GI  endoscopy was accomplished without                            difficulty. The patient tolerated the procedure                            well. Scope In: Scope Out: Findings:                 The examined portions of the nasopharynx,                            oropharynx and larynx were normal.                           The examined esophagus was normal. No obvious                            stricture or stenosis seen, but given patient's                            frequent bothersome symptoms, empiric dilation was                            performed. A guidewire was placed and the scope was                            withdrawn. Dilation was performed with a Savary                            dilator with mild resistance at 17 mm. The dilation                            site was examined following endoscope reinsertion                            and showed no change. Estimated blood loss: none.  Biopsies were obtained from the proximal and distal                            esophagus with cold forceps for histology of                            suspected eosinophilic esophagitis. Estimated blood                            loss was minimal.                           Scattered mild inflammation characterized by                            erosions/erythema was found in the prepyloric                            region of the stomach. Biopsies were taken with a                            cold forceps for Helicobacter pylori testing.                            Estimated blood loss was minimal.                           The exam of the stomach was otherwise normal.                           Patchy mildly erythematous mucosa was found in the                            duodenal bulb. Biopsies were taken with a cold                            forceps for histology. Estimated blood loss was                            minimal.                           The exam of the duodenum  was otherwise normal. Complications:            No immediate complications. Estimated Blood Loss:     Estimated blood loss was minimal. Impression:               - The examined portions of the nasopharynx,                            oropharynx and larynx were normal.                           - Normal esophagus. Dilated.                           -  Gastritis. Biopsied.                           - Erythematous duodenopathy. Biopsied.                           - Biopsies were taken with a cold forceps for                            evaluation of eosinophilic esophagitis. Recommendation:           - Patient has a contact number available for                            emergencies. The signs and symptoms of potential                            delayed complications were discussed with the                            patient. Return to normal activities tomorrow.                            Written discharge instructions were provided to the                            patient.                           - Resume previous diet.                           - Continue present medications. Start Protonix  as                            previously prescribed.                           - Await pathology results.                           - Avoid NSAIDs. Philis Doke E. Stacia, MD 12/07/2023 10:51:19 AM This report has been signed electronically.

## 2023-12-07 NOTE — Progress Notes (Signed)
 Called to recovery to speak with patient.  She has complaints and wanted to speak to a charge nurse. She states that she has had a terrible experience and this is the worst place she has ever been. She complained about her experience in admitting.  Complained that the noise level was too loud and the nurses did not care about her  She states that we did not care about her and did not give her good care.  She is also complaining about her recovery nurse and that she lifted the back of the bed without telling her and that she should have been easier and told her ahead of time.  She repeatedly stated that she was never coming back here again and continued to call us  bitches.  She was discharged home with her fiance.

## 2023-12-07 NOTE — Op Note (Signed)
 [facility] Patient Name: Melissa Davies Procedure Date: 12/07/2023 9:43 AM MRN: [id number] Endoscopist: Glendia E. Stacia , MD, [id number] Age: 53 Referring MD:  Date of Birth: May 08, 1970 Gender: Female Account #: [id number] Procedure:                Colonoscopy Indications:              Screening for colorectal malignant neoplasm, This                            is the patient's first colonoscopy Medicines:                Monitored Anesthesia Care Procedure:                Pre-Anesthesia Assessment:                           - Prior to the procedure, a History and Physical                            was performed, and patient medications and                            allergies were reviewed. The patient's tolerance of                            previous anesthesia was also reviewed. The risks                            and benefits of the procedure and the sedation                            options and risks were discussed with the patient.                            All questions were answered, and informed consent                            was obtained. Prior Anticoagulants: The patient has                            taken no anticoagulant or antiplatelet agents. ASA                            Grade Assessment: II - A patient with mild systemic                            disease. After reviewing the risks and benefits,                            the patient was deemed in satisfactory condition to                            undergo the procedure.  After obtaining informed consent, the colonoscope                            was passed under direct vision. Throughout the                            procedure, the patient's blood pressure, pulse, and                            oxygen saturations were monitored continuously. The                            Olympus Scope SN: [id number] was introduced through                            the anus and  advanced to the the terminal ileum,                            with identification of the appendiceal orifice and                            IC valve. The colonoscopy was performed without                            difficulty. The patient tolerated the procedure                            well. The quality of the bowel preparation was                            excellent. The terminal ileum, ileocecal valve,                            appendiceal orifice, and rectum were photographed.                            The bowel preparation used was SUPREP via split                            dose instruction. Scope In: 10:18:27 AM Scope Out: 10:39:59 AM Scope Withdrawal Time: 0 hours 18 minutes 26 seconds  Total Procedure Duration: 0 hours 21 minutes 32 seconds  Findings:                 The perianal and digital rectal examinations were                            normal. Pertinent negatives include normal                            sphincter tone and no palpable rectal lesions.                           A 5 mm polyp was found in the cecum. The polyp was  sessile. The polyp was in a difficult location for                            resection andThe polyp was removed with a cold                            snare. Resection and retrieval were complete.                            Estimated blood loss was minimal.                           A 4 mm polyp was found in the transverse colon. The                            polyp was sessile. The polyp was removed with a                            cold snare. Resection and retrieval were complete.                            Estimated blood loss was minimal.                           The exam was otherwise normal throughout the                            examined colon.                           The terminal ileum appeared normal.                           The retroflexed view of the distal rectum and anal                             verge was normal and showed no anal or rectal                            abnormalities. Complications:            No immediate complications. Estimated Blood Loss:     Estimated blood loss was minimal. Impression:               - One 5 mm polyp in the cecum, removed with a cold                            snare. Resected and retrieved.                           - One 4 mm polyp in the transverse colon, removed                            with a cold snare. Resected and retrieved.                           -  The examined portion of the ileum was normal.                           - The distal rectum and anal verge are normal on                            retroflexion view. Recommendation:           - Patient has a contact number available for                            emergencies. The signs and symptoms of potential                            delayed complications were discussed with the                            patient. Return to normal activities tomorrow.                            Written discharge instructions were provided to the                            patient.                           - Resume previous diet.                           - Continue present medications.                           - Await pathology results.                           - Repeat colonoscopy (date not yet determined) for                            surveillance based on pathology results. Aretha Levi E. Stacia, MD 12/07/2023 11:00:16 AM This report has been signed electronically.

## 2023-12-07 NOTE — Progress Notes (Signed)
 Sedate, gd SR, tolerated procedure well, VSS, report to RN

## 2023-12-07 NOTE — Progress Notes (Signed)
 Patient is stating pain.  Will not give me a number.  Gave patient levsin and she spit it out. Patient given simethicone.  Husband states that she does not do pain well at all. Patient's abd is soft, and she is passing gas. Patient said that she was up all night.   Patient stating that the people in admitting did not care that she was nervous. Patient said the f word at the adm nurses.   Nurses did speak to her at length about the procedure, and did reassure her. Patient is calling her recovery nurse and all of the nurses bitches. Patient is yelling at the nurse using the F word. Husband is telling her to stop swearing.   Patient kept swearing at the nurses. Still refuses to give nurse a pain score.   Patient was asked to slide her butt back. She refused so the head of the bed was eased up.Patient used the f word again. Patient complained about sitting up too fast.  Husband tried to calm her down and told her that she was being rude. She continued to swear at the staff.   Took all of her leads off and her blood pressure cuff. Patient swearing and then  stated that she had pain.  Abd soft and husband stated that she just needed to go home, and that she would be fine.  Husband stated again that she did not do pain well. Patients spoke to the charge nurse, and the patient continued to talk about the nurses in admitting. Patient continued to call the nurses bitches and used the F word. Charge nurse offered the patient stay to get the air out.  She was asked if she wanted to sit on the toilet. Patient refused. Husband called back from getting the car.  Husband still trying to calm patient down. Told her that  she needed  to just go home. Husband told her that nothing had to do with race. Patient did not state pain upon discharge.

## 2023-12-07 NOTE — Patient Instructions (Addendum)
 Resume your previous medications today as ordered. Read all of your discharge instructions.    OU HAD AN ENDOSCOPIC PROCEDURE TODAY AT THE [facility]:   Refer to the procedure report that was given to you for any specific questions about what was found during the examination.  If the procedure report does not answer your questions, please call your gastroenterologist to clarify.  If you requested that your care partner not be given the details of your procedure findings, then the procedure report has been included in a sealed envelope for you to review at your convenience later.  YOU SHOULD EXPECT: Some feelings of bloating in the abdomen. Passage of more gas than usual.  Walking can help get rid of the air that was put into your GI tract during the procedure and reduce the bloating. If you had a lower endoscopy (such as a colonoscopy or flexible sigmoidoscopy) you may notice spotting of blood in your stool or on the toilet paper. If you underwent a bowel prep for your procedure, you may not have a normal bowel movement for a few days.  Please Note:  You might notice some irritation and congestion in your nose or some drainage.  This is from the oxygen used during your procedure.  There is no need for concern and it should clear up in a day or so.  SYMPTOMS TO REPORT IMMEDIATELY:  Following lower endoscopy (colonoscopy or flexible sigmoidoscopy):  Excessive amounts of blood in the stool  Significant tenderness or worsening of abdominal pains  Swelling of the abdomen that is new, acute  Fever of 100F or higher  Following upper endoscopy (EGD)  Vomiting of blood or coffee ground material  New chest pain or pain under the shoulder blades  Painful or persistently difficult swallowing  New shortness of breath  Fever of 100F or higher  Black, tarry-looking stools  For urgent or emergent issues, a gastroenterologist can be reached at any hour by calling [phone]. Do not use  MyChart messaging for urgent concerns.    DIET:  We do recommend a small meal at first, but then you may proceed to your regular diet.  Drink plenty of fluids but you should avoid alcoholic beverages for 24 hours.  ACTIVITY:  You should plan to take it easy for the rest of today and you should NOT DRIVE or use heavy machinery until tomorrow (because of the sedation medicines used during the test).    FOLLOW UP: Our staff will call the number listed on your records the next business day following your procedure.  We will call around 7:15- 8:00 am to check on you and address any questions or concerns that you may have regarding the information given to you following your procedure. If we do not reach you, we will leave a message.     If any biopsies were taken you will be contacted by phone or by letter within the next 1-3 weeks.  Please call us  at [phone] if you have not heard about the biopsies in 3 weeks.    SIGNATURES/CONFIDENTIALITY: You and/or your care partner have signed paperwork which will be entered into your electronic medical record.  These signatures attest to the fact that that the information above on your After Visit Summary has been reviewed and is understood.  Full responsibility of the confidentiality of this discharge information lies with you and/or your care-partner.

## 2023-12-07 NOTE — Progress Notes (Signed)
 Pt's states no medical or surgical changes since previsit or office visit.

## 2023-12-07 NOTE — Progress Notes (Signed)
 History and Physical Interval Note:  12/07/2023 9:50 AM  Melissa Davies  has presented today for endoscopic procedure(s), with the diagnosis of  Encounter Diagnoses  Name Primary?   Gastroesophageal reflux disease, unspecified whether esophagitis present Yes   Dysphagia, unspecified type    Colon cancer screening   .  The various methods of evaluation and treatment have been discussed with the patient and/or family. After consideration of risks, benefits and other options for treatment, the patient has consented to  the endoscopic procedure(s).   The patient's history has been reviewed, patient examined, no change in status, stable for endoscopic procedure(s).  I have reviewed the patient's chart and labs.  Questions were answered to the patient's satisfaction.    Patient has not yet started the Protonix  prescribed by Ellouise Honora Hamilton E. Stacia, MD [facility]

## 2023-12-08 ENCOUNTER — Telehealth: Payer: Self-pay | Admitting: *Deleted

## 2023-12-08 ENCOUNTER — Ambulatory Visit: Payer: MEDICAID | Admitting: Occupational Therapy

## 2023-12-08 NOTE — Telephone Encounter (Signed)
  Follow up Call-     12/07/2023    9:07 AM  Call back number  Post procedure Call Back phone  # [phone]  Permission to leave phone message Yes   Left message to call back if any questions or concerns.

## 2023-12-09 ENCOUNTER — Ambulatory Visit: Payer: Self-pay | Admitting: Gastroenterology

## 2023-12-09 LAB — SURGICAL PATHOLOGY

## 2023-12-09 NOTE — Progress Notes (Signed)
 Melissa Davies,  The biopsies taken from your stomach were notable for mild chronic gastritis (inflammation) which is a common finding, but there was no evidence of Helicobacter pylori infection. This common finding is not likely to explain abdominal pain and there is no specific treatment or further evaluation recommended.  The biopsies of your duodenum showed benign inflammatory changes, likely related to stomach acid or from NSAIDs, but no evidence of celiac disease and no precancerous changes.  The biopsies of your esophagus were normal.  There was no evidence of damage from acid reflux and no evidence of eosinophilic esophagitis to explain your swallowing difficulties.  The two polyps which I removed during your recent colonoscopy were proven to be completely benign but are considered pre-cancerous polyps that MAY have grown into cancer if they had not been removed.  Studies shows that at least 20% of women over age 31 and 30% of men over age 54 have pre-cancerous polyps.  Based on current nationally recognized surveillance guidelines, I recommend that you have a repeat colonoscopy in 7 years.   If you develop any new rectal bleeding, abdominal pain or significant bowel habit changes, please contact me before then.

## 2023-12-14 ENCOUNTER — Encounter: Payer: MEDICAID | Admitting: Occupational Therapy

## 2023-12-16 ENCOUNTER — Encounter: Payer: MEDICAID | Admitting: Occupational Therapy

## 2024-01-20 ENCOUNTER — Ambulatory Visit (HOSPITAL_BASED_OUTPATIENT_CLINIC_OR_DEPARTMENT_OTHER): Payer: MEDICAID | Admitting: Physical Therapy

## 2024-01-27 ENCOUNTER — Encounter (HOSPITAL_BASED_OUTPATIENT_CLINIC_OR_DEPARTMENT_OTHER): Payer: MEDICAID | Admitting: Physical Therapy

## 2024-02-03 ENCOUNTER — Encounter (HOSPITAL_BASED_OUTPATIENT_CLINIC_OR_DEPARTMENT_OTHER): Payer: MEDICAID | Admitting: Physical Therapy

## 2024-02-10 ENCOUNTER — Encounter (HOSPITAL_BASED_OUTPATIENT_CLINIC_OR_DEPARTMENT_OTHER): Payer: MEDICAID | Admitting: Physical Therapy

## 2024-02-11 ENCOUNTER — Ambulatory Visit: Payer: MEDICAID | Admitting: Podiatry

## 2024-02-11 DIAGNOSIS — L600 Ingrowing nail: Secondary | ICD-10-CM | POA: Diagnosis not present

## 2024-02-11 NOTE — Progress Notes (Signed)
 Subjective:  Patient ID: Melissa Davies, female    DOB: February 18, 1971,  MRN: [id number]  Chief Complaint  Patient presents with   Nail Problem    Pt would like to have her nail removed     53 y.o. female presents with the above complaint.  Patient presents with thickened elongated dystrophic mycotic nail x 1.  She states she has been dealing with this ingrown's on both sides for quite some time now the whole nail is starting she would like to have it removed she would like to make appointment this is bilateral dull aching nature denies any loss or to see me for this.  History of   Review of Systems: Negative except as noted in the HPI. Denies N/V/F/Ch.  Past Medical History:  Diagnosis Date   ADHD    Anxiety    Bipolar disorder (manic depression) (HCC)    HLD (hyperlipidemia)    PTSD (post-traumatic stress disorder)    Vitamin D deficiency     Current Outpatient Medications:    buPROPion  (WELLBUTRIN  XL) 300 MG 24 hr tablet, Take 300 mg by mouth daily., Disp: , Rfl:    FLUoxetine  (PROZAC ) 20 MG capsule, Take 20 mg by mouth every morning. (Patient not taking: Reported on 12/07/2023), Disp: , Rfl:    gabapentin  (NEURONTIN ) 300 MG capsule, Take 300 mg by mouth. ake 1 capsule (300 mg total) by mouth 3 (three) times a day. Start with one pill at night for 2 nights then one pill in morning and one at night for 2 days then up to three times a day, Disp: , Rfl:    hydrOXYzine  (ATARAX ) 25 MG tablet, Take 12.5-25 mg by mouth 3 (three) times daily as needed. (Patient not taking: Reported on 12/07/2023), Disp: , Rfl:    MEDROL 4 MG TBPK tablet, Take 4 mg by mouth as directed. (Patient not taking: Reported on 12/07/2023), Disp: , Rfl:    meloxicam  (MOBIC ) 15 MG tablet, Take 15 mg by mouth daily. (Patient not taking: Reported on 12/07/2023), Disp: , Rfl:    methocarbamol  (ROBAXIN ) 500 MG tablet, Take 1 tablet (500 mg total) by mouth 2 (two) times daily. (Patient not taking: Reported on 12/07/2023),  Disp: 20 tablet, Rfl: 0   methylphenidate  (RITALIN ) 10 MG tablet, Take 10 mg by mouth 2 (two) times daily with breakfast and lunch., Disp: , Rfl:    mirtazapine  (REMERON ) 7.5 MG tablet, Take 7.5 mg by mouth at bedtime. (Patient not taking: Reported on 12/07/2023), Disp: , Rfl:    pantoprazole  (PROTONIX ) 40 MG tablet, Take 1 tablet (40 mg total) by mouth daily. (Patient not taking: Reported on 12/07/2023), Disp: 90 tablet, Rfl: 3   pregabalin (LYRICA) 75 MG capsule, Take 75 mg by mouth 2 (two) times daily. (Patient not taking: Reported on 12/07/2023), Disp: , Rfl:    rosuvastatin (CRESTOR) 20 MG tablet, Take 20 mg by mouth daily., Disp: , Rfl:    senna-docusate (SENOKOT S) 8.6-50 MG tablet, Take 1 tablet by mouth 2 (two) times daily. (Patient not taking: Reported on 12/07/2023), Disp: 60 tablet, Rfl: 5   tiZANidine (ZANAFLEX) 4 MG tablet, Take 4 mg by mouth every 6 (six) hours as needed. (Patient not taking: Reported on 12/07/2023), Disp: , Rfl:    Vitamin D, Ergocalciferol, (DRISDOL) 1.25 MG (50000 UNIT) CAPS capsule, Take 50,000 Units by mouth every 7 (seven) days., Disp: , Rfl:   Current Facility-Administered Medications:    0.9 %  sodium chloride  infusion, 500 mL, Intravenous, Once,  Stacia Glendia BRAVO, MD  Social History   Tobacco Use  Smoking Status Never  Smokeless Tobacco Never    No Known Allergies Objective:  There were no vitals filed for this visit. There is no height or weight on file to calculate BMI. Constitutional Well developed. Well nourished.  Vascular Dorsalis pedis pulses palpable bilaterally. Posterior tibial pulses palpable bilaterally. Capillary refill normal to all digits.  No cyanosis or clubbing noted. Pedal hair growth normal.  Neurologic Normal speech. Oriented to person, place, and time. Epicritic sensation to light touch grossly present bilaterally.  Dermatologic Pain on palpation of the entire/total nail on 1st digit of the left No other open wounds. No  skin lesions.  Orthopedic: Normal joint ROM without pain or crepitus bilaterally. No visible deformities. No bony tenderness.   Radiographs: None Assessment:  No diagnosis found. Plan:  Patient was evaluated and treated and all questions answered.  Nail contusion/dystrophy hallux, left -Patient elects to proceed with minor surgery to remove entire toenail today. Consent reviewed and signed by patient. -Entire/total nail excised. See procedure note. -Educated on post-procedure care including soaking. Written instructions provided and reviewed. -Patient to follow up in 2 weeks for nail check.  Procedure: Excision of entire/total nail with phenol matricectomy Location: Left 1st toe digit Anesthesia: Lidocaine  1% plain; 1.5 mL and Marcaine 0.5% plain; 1.5 mL, digital block. Skin Prep: Betadine. Dressing: Silvadene; telfa; dry, sterile, compression dressing. Technique: Following skin prep, the toe was exsanguinated and a tourniquet was secured at the base of the toe. The affected nail border was freed and excised. The tourniquet was then removed and sterile dressing applied. Disposition: Patient tolerated procedure well. Patient to return in 2 weeks for follow-up.   No follow-ups on file.

## 2024-02-16 ENCOUNTER — Ambulatory Visit: Payer: MEDICAID

## 2024-02-16 ENCOUNTER — Other Ambulatory Visit: Payer: Self-pay

## 2024-02-16 DIAGNOSIS — M546 Pain in thoracic spine: Secondary | ICD-10-CM | POA: Diagnosis present

## 2024-02-16 DIAGNOSIS — M542 Cervicalgia: Secondary | ICD-10-CM | POA: Diagnosis present

## 2024-02-16 DIAGNOSIS — M5459 Other low back pain: Secondary | ICD-10-CM | POA: Diagnosis present

## 2024-02-16 NOTE — Therapy (Incomplete)
 OUTPATIENT PHYSICAL THERAPY THORACOLUMBAR EVALUATION   Patient Name: Melissa Davies MRN: [id number] DOB:Jul 17, 1970, 53 y.o., female Today's Date: 02/16/2024  END OF SESSION:   Past Medical History:  Diagnosis Date   ADHD    Anxiety    Bipolar disorder (manic depression) (HCC)    HLD (hyperlipidemia)    PTSD (post-traumatic stress disorder)    Vitamin D deficiency    Past Surgical History:  Procedure Laterality Date   CARPAL TUNNEL RELEASE Bilateral    COLONOSCOPY     TUBAL LIGATION     Patient Active Problem List   Diagnosis Date Noted   Contusion of hand 09/20/2023   DDD (degenerative disc disease), cervical 08/18/2023   Bilateral hand pain 08/17/2023   Acute strain of neck muscle 08/17/2023   Carpal tunnel syndrome of right wrist 03/09/2023   High thyroid stimulating hormone (TSH) level 05/19/2022   Mood disorder 05/19/2022   Other obesity due to excess calories 05/19/2022   Acute otalgia, right 04/19/2020   Hearing loss of right ear due to cerumen impaction 04/19/2020   Bipolar I disorder, most recent episode depressed (HCC) 10/25/2019   MDD (major depressive disorder), recurrent episode, severe (HCC) 07/22/2019   Major depressive disorder, recurrent episode 07/22/2019   Suicidal ideation 07/21/2019   PTSD (post-traumatic stress disorder) 09/13/2013   Depression, reactive 05/24/2013   ADD (attention deficit disorder) 09/26/2012   Generalized anxiety disorder 09/26/2012   BMI 36.0-36.9,adult 09/26/2012   History of tubal ligation 09/30/2011    PCP: Celestia Harder, NP   REFERRING PROVIDER:  Reyne Cordella SQUIBB, MD     REFERRING DIAG: M54.50 (ICD-10-CM) - Pain of lumbar spine   Rationale for Evaluation and Treatment: Rehabilitation  THERAPY DIAG:  No diagnosis found.  ONSET DATE: ***  SUBJECTIVE:                                                                                                                                                                                            SUBJECTIVE STATEMENT: Patient reports that she was originally seen back  She states that she was unable to tolerate PT for her neck and back. She states that Dr. Reyne explained that she had some swelling on her neck. She was only able to tolerate the two aquatic visits.   PERTINENT HISTORY:  Relevant PMHx includes ADHD, Anxiety, Bipolar disorder, PTSD, Vitamin D deficiency, Hight TSH, Obesity, R side hearing loss  PAIN:  Are you having pain? No and Yes: NPRS scale: 10/10 Pain location: *** Pain description: *** Aggravating factors: *** Relieving factors: muscle relaxers  PRECAUTIONS: {Therapy precautions:24002}  RED FLAGS: None  WEIGHT BEARING RESTRICTIONS: No  FALLS:  Has patient fallen in last 6 months? No  LIVING ENVIRONMENT: Lives with: lives alone Lives in: House/apartment Stairs: No Has following equipment at home: None  OCCUPATION: sewing, upholstery   PLOF: {PLOF:24004}  PATIENT GOALS: To get better and return to work and making a living for myself  NEXT MD VISIT: ***  OBJECTIVE:  Note: Objective measures were completed at Evaluation unless otherwise noted.  DIAGNOSTIC FINDINGS:  ***  PATIENT SURVEYS:  Modified Oswestry:  MODIFIED OSWESTRY DISABILITY SCALE  Date: 02/16/2024 Score  Pain intensity 3 =  Pain medication provides me with moderate relief from pain.  2. Personal care (washing, dressing, etc.) 5 =  I do not get dressed, wash with difficulty, and stay in bed.  3. Lifting 4 = I can lift only very light weights  4. Walking 3 =  Pain prevents me from walking more than  mile.  5. Sitting 3 =  Pain prevents me from sitting more than  hour.  6. Standing 3 =  Pain prevents me from standing more than 1/2 hour.  7. Sleeping 4 =  Even when I take pain medication, I sleep less than 2 hour  8. Social Life 5 =  I have hardly any social life because of my pain.  9. Traveling 5 = My pain prevents all travel except for  visits to the physician/therapist or hospital  10. Employment/ Homemaking 5 = Pain prevents me from performing any job or homemaking chores.  Total 40/50   Interpretation of scores: Score Category Description  0-20% Minimal Disability The patient can cope with most living activities. Usually no treatment is indicated apart from advice on lifting, sitting and exercise  21-40% Moderate Disability The patient experiences more pain and difficulty with sitting, lifting and standing. Travel and social life are more difficult and they may be disabled from work. Personal care, sexual activity and sleeping are not grossly affected, and the patient can usually be managed by conservative means  41-60% Severe Disability Pain remains the main problem in this group, but activities of daily living are affected. These patients require a detailed investigation  61-80% Crippled Back pain impinges on all aspects of the patient's life. Positive intervention is required  81-100% Bed-bound These patients are either bed-bound or exaggerating their symptoms  Bluford FORBES Zoe DELENA Karon DELENA, et al. Surgery versus conservative management of stable thoracolumbar fracture: the PRESTO feasibility RCT. [place] ([place]): [employer]; 2021 Nov. [facility] Technology Assessment, No. 25.62.) Appendix 3, Oswestry Disability Index category descriptors. Available from: Findjewelers.cz  Minimally Clinically Important Difference (MCID) = 12.8%  COGNITION: Overall cognitive status: Within functional limits for tasks assessed     SENSATION: {sensation:27233}  MUSCLE LENGTH: Hamstrings: Right *** deg; Left *** deg Debby test: Right *** deg; Left *** deg  POSTURE: {posture:25561}  PALPATION: ***  CS flexion 30%,  Extension 20% L rot 25% R rotation 25% L sidebend 15%  R sode bemd  LUMBAR ROM:   AROM eval  Flexion 50%  Extension 10%  Right lateral flexion 75%  Left lateral flexion  75%  Right rotation   Left rotation    (Blank rows = not tested)  LOWER EXTREMITY MMT:    MMT Right eval Left eval  Hip flexion    Hip extension    Hip abduction    Hip adduction    Hip internal rotation    Hip external rotation    Knee flexion  Knee extension    Ankle dorsiflexion    Ankle plantarflexion    Ankle inversion    Ankle eversion     (Blank rows = not tested)  LUMBAR SPECIAL TESTS:  {lumbar special test:25242}  FUNCTIONAL TESTS:  {Functional tests:24029}  GAIT: Distance walked: *** Assistive device utilized: {Assistive devices:23999} Level of assistance: {Levels of assistance:24026} Comments: ***  TREATMENT DATE:   [facility] Adult PT Treatment:                                                DATE: 02/16/2024   Initial evaluation: see patient education and home exercise program as noted below                                                                                                                                   PATIENT EDUCATION:  Education details: reviewed initial home exercise program; discussion of POC, prognosis and goals for skilled PT   Person educated: Patient Education method: Explanation, Demonstration, and Handouts Education comprehension: verbalized understanding, returned demonstration, and needs further education  HOME EXERCISE PROGRAM: ***  ASSESSMENT:  CLINICAL IMPRESSION: Vergia is a 53 y.o. female who was seen today for physical therapy evaluation and treatment for ***. She is demonstrating ***. She has related pain and difficulty with ***. She requires skilled PT services at this time to address relevant deficits and improve overall function.     OBJECTIVE IMPAIRMENTS: {opptimpairments:25111}.   ACTIVITY LIMITATIONS: {activitylimitations:27494}  PARTICIPATION LIMITATIONS: {participationrestrictions:25113}  PERSONAL FACTORS: {Personal factors:25162} are also affecting patient's functional outcome.   REHAB  POTENTIAL: {rehabpotential:25112}  CLINICAL DECISION MAKING: {clinical decision making:25114}  EVALUATION COMPLEXITY: {Evaluation complexity:25115}   GOALS: Goals reviewed with patient? YES  SHORT TERM GOALS: Target date: 03/08/2024    Patient will be independent with initial home program at least 3 days/week.  Baseline: provided at eval Goal Status: INITIAL   2.  Patient will demonstrate improved postural awareness for at least 15 minutes while seated without need for cueing from PT.  Baseline: see objective measures Goal Status: INITIAL   3.  *** Baseline:  Goal status: INITIAL   LONG TERM GOALS: Target date: 03/29/2024 ]  Patient will report improved overall functional ability with ODI score of ***.  Baseline:  Goal Status: INITIAL    2.  *** Baseline:  Goal status: INITIAL  3.  *** Baseline:  Goal status: INITIAL  4.  *** Baseline:  Goal status: INITIAL    PLAN:  PT FREQUENCY: 1-2x/week  PT DURATION: 6 weeks  PLANNED INTERVENTIONS: {rehab planned interventions:25118::97110-Therapeutic exercises,97530- Therapeutic [id number]- Neuromuscular re-education,97535- Self Rjmz,02859- Manual therapy,Patient/Family education}.  PLAN FOR NEXT SESSION: PIERRETTE Marko Molt, PT 02/16/2024, 8:30 AM

## 2024-02-18 ENCOUNTER — Ambulatory Visit: Payer: MEDICAID | Admitting: Podiatry

## 2024-02-24 ENCOUNTER — Ambulatory Visit: Payer: MEDICAID

## 2024-02-24 DIAGNOSIS — M542 Cervicalgia: Secondary | ICD-10-CM | POA: Diagnosis not present

## 2024-02-24 DIAGNOSIS — M5459 Other low back pain: Secondary | ICD-10-CM

## 2024-02-24 DIAGNOSIS — M546 Pain in thoracic spine: Secondary | ICD-10-CM
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
## 2024-02-25 ENCOUNTER — Ambulatory Visit: Payer: MEDICAID
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
## 2024-02-28 ENCOUNTER — Ambulatory Visit: Payer: MEDICAID

## 2024-02-28 DIAGNOSIS — M546 Pain in thoracic spine: Secondary | ICD-10-CM

## 2024-02-28 DIAGNOSIS — M542 Cervicalgia: Secondary | ICD-10-CM

## 2024-02-28 DIAGNOSIS — M5459 Other low back pain: Secondary | ICD-10-CM
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
## 2024-03-02 ENCOUNTER — Ambulatory Visit: Payer: MEDICAID

## 2024-03-03 ENCOUNTER — Ambulatory Visit: Payer: MEDICAID

## 2024-03-03 DIAGNOSIS — M5459 Other low back pain: Secondary | ICD-10-CM

## 2024-03-03 DIAGNOSIS — M542 Cervicalgia: Secondary | ICD-10-CM

## 2024-03-03 DIAGNOSIS — M546 Pain in thoracic spine: Secondary | ICD-10-CM
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
## 2024-03-06 ENCOUNTER — Ambulatory Visit: Payer: MEDICAID

## 2024-03-06 DIAGNOSIS — M5459 Other low back pain: Secondary | ICD-10-CM

## 2024-03-06 DIAGNOSIS — M542 Cervicalgia: Secondary | ICD-10-CM

## 2024-03-06 DIAGNOSIS — M546 Pain in thoracic spine: Secondary | ICD-10-CM
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
## 2024-03-08 ENCOUNTER — Ambulatory Visit: Payer: MEDICAID

## 2024-03-08 DIAGNOSIS — M5459 Other low back pain: Secondary | ICD-10-CM

## 2024-03-08 DIAGNOSIS — M542 Cervicalgia: Secondary | ICD-10-CM | POA: Diagnosis not present

## 2024-03-08 DIAGNOSIS — M546 Pain in thoracic spine: Secondary | ICD-10-CM
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
## 2024-03-14 ENCOUNTER — Ambulatory Visit: Payer: MEDICAID

## 2024-03-14 DIAGNOSIS — M542 Cervicalgia: Secondary | ICD-10-CM | POA: Diagnosis present

## 2024-03-14 DIAGNOSIS — M6281 Muscle weakness (generalized): Secondary | ICD-10-CM | POA: Diagnosis present

## 2024-03-14 DIAGNOSIS — M546 Pain in thoracic spine: Secondary | ICD-10-CM | POA: Diagnosis present

## 2024-03-14 DIAGNOSIS — R208 Other disturbances of skin sensation: Secondary | ICD-10-CM | POA: Diagnosis present

## 2024-03-14 DIAGNOSIS — M5459 Other low back pain: Secondary | ICD-10-CM | POA: Diagnosis present
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
## 2024-03-15 ENCOUNTER — Ambulatory Visit: Payer: MEDICAID

## 2024-03-15 DIAGNOSIS — M542 Cervicalgia: Secondary | ICD-10-CM | POA: Diagnosis not present

## 2024-03-15 DIAGNOSIS — M5459 Other low back pain: Secondary | ICD-10-CM

## 2024-03-15 DIAGNOSIS — M546 Pain in thoracic spine: Secondary | ICD-10-CM
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
## 2024-03-17 ENCOUNTER — Ambulatory Visit: Payer: MEDICAID

## 2024-03-22 ENCOUNTER — Ambulatory Visit: Payer: MEDICAID

## 2024-03-22 DIAGNOSIS — M542 Cervicalgia: Secondary | ICD-10-CM

## 2024-03-22 DIAGNOSIS — M6281 Muscle weakness (generalized): Secondary | ICD-10-CM

## 2024-03-22 DIAGNOSIS — M546 Pain in thoracic spine: Secondary | ICD-10-CM

## 2024-03-22 DIAGNOSIS — M5459 Other low back pain: Secondary | ICD-10-CM

## 2024-03-22 DIAGNOSIS — R208 Other disturbances of skin sensation: Secondary | ICD-10-CM
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
## 2024-03-24 ENCOUNTER — Ambulatory Visit: Payer: MEDICAID

## 2024-03-24 DIAGNOSIS — M5459 Other low back pain: Secondary | ICD-10-CM

## 2024-03-24 DIAGNOSIS — M542 Cervicalgia: Secondary | ICD-10-CM

## 2024-03-24 DIAGNOSIS — M546 Pain in thoracic spine: Secondary | ICD-10-CM

## 2024-03-24 DIAGNOSIS — M6281 Muscle weakness (generalized): Secondary | ICD-10-CM
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
## 2024-03-29 ENCOUNTER — Ambulatory Visit: Payer: MEDICAID

## 2024-03-29 DIAGNOSIS — M546 Pain in thoracic spine: Secondary | ICD-10-CM

## 2024-03-29 DIAGNOSIS — M542 Cervicalgia: Secondary | ICD-10-CM

## 2024-03-29 DIAGNOSIS — M6281 Muscle weakness (generalized): Secondary | ICD-10-CM

## 2024-03-29 DIAGNOSIS — M5459 Other low back pain: Secondary | ICD-10-CM
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
## 2024-03-31 ENCOUNTER — Ambulatory Visit: Payer: MEDICAID

## 2024-03-31 DIAGNOSIS — M542 Cervicalgia: Secondary | ICD-10-CM | POA: Diagnosis not present

## 2024-03-31 DIAGNOSIS — M6281 Muscle weakness (generalized): Secondary | ICD-10-CM

## 2024-03-31 DIAGNOSIS — M5459 Other low back pain: Secondary | ICD-10-CM

## 2024-03-31 DIAGNOSIS — M546 Pain in thoracic spine: Secondary | ICD-10-CM
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
## 2024-04-05 ENCOUNTER — Ambulatory Visit: Payer: MEDICAID

## 2024-04-05 DIAGNOSIS — M542 Cervicalgia: Secondary | ICD-10-CM

## 2024-04-05 DIAGNOSIS — M5459 Other low back pain: Secondary | ICD-10-CM

## 2024-04-05 DIAGNOSIS — M6281 Muscle weakness (generalized): Secondary | ICD-10-CM

## 2024-04-05 DIAGNOSIS — M546 Pain in thoracic spine: Secondary | ICD-10-CM
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
## 2024-04-07 ENCOUNTER — Ambulatory Visit: Payer: MEDICAID

## 2024-04-07 ENCOUNTER — Telehealth: Payer: Self-pay

## 2024-04-07 NOTE — Telephone Encounter (Signed)
 VM is full

## 2024-04-10 ENCOUNTER — Ambulatory Visit: Payer: MEDICAID

## 2024-04-10 DIAGNOSIS — M546 Pain in thoracic spine: Secondary | ICD-10-CM

## 2024-04-10 DIAGNOSIS — M542 Cervicalgia: Secondary | ICD-10-CM | POA: Diagnosis not present

## 2024-04-10 DIAGNOSIS — M5459 Other low back pain: Secondary | ICD-10-CM

## 2024-04-10 DIAGNOSIS — M6281 Muscle weakness (generalized): Secondary | ICD-10-CM
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
## 2024-04-11 ENCOUNTER — Ambulatory Visit: Payer: MEDICAID

## 2024-04-11 DIAGNOSIS — M542 Cervicalgia: Secondary | ICD-10-CM

## 2024-04-11 DIAGNOSIS — M6281 Muscle weakness (generalized): Secondary | ICD-10-CM

## 2024-04-11 DIAGNOSIS — M546 Pain in thoracic spine: Secondary | ICD-10-CM

## 2024-04-11 NOTE — Addendum Note (Signed)
 Addended by: NYLE BARER A on: 04/11/2024 10:26 AM   Modules accepted: Orders
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
## 2024-04-19 ENCOUNTER — Ambulatory Visit: Payer: MEDICAID

## 2024-04-19 DIAGNOSIS — M5459 Other low back pain: Secondary | ICD-10-CM | POA: Diagnosis present

## 2024-04-19 DIAGNOSIS — M6281 Muscle weakness (generalized): Secondary | ICD-10-CM | POA: Diagnosis present

## 2024-04-19 DIAGNOSIS — M542 Cervicalgia: Secondary | ICD-10-CM | POA: Insufficient documentation

## 2024-04-19 DIAGNOSIS — M546 Pain in thoracic spine: Secondary | ICD-10-CM | POA: Insufficient documentation
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
## 2024-04-21 ENCOUNTER — Ambulatory Visit: Payer: MEDICAID

## 2024-04-21 DIAGNOSIS — M542 Cervicalgia: Secondary | ICD-10-CM

## 2024-04-21 DIAGNOSIS — M5459 Other low back pain: Secondary | ICD-10-CM

## 2024-04-21 DIAGNOSIS — M546 Pain in thoracic spine: Secondary | ICD-10-CM

## 2024-04-21 DIAGNOSIS — M6281 Muscle weakness (generalized): Secondary | ICD-10-CM
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
## 2024-04-26 ENCOUNTER — Ambulatory Visit: Payer: MEDICAID

## 2024-04-26 DIAGNOSIS — M546 Pain in thoracic spine: Secondary | ICD-10-CM

## 2024-04-26 DIAGNOSIS — M6281 Muscle weakness (generalized): Secondary | ICD-10-CM

## 2024-04-26 DIAGNOSIS — M5459 Other low back pain: Secondary | ICD-10-CM

## 2024-04-26 DIAGNOSIS — M542 Cervicalgia: Secondary | ICD-10-CM
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
## 2024-04-28 ENCOUNTER — Ambulatory Visit: Payer: MEDICAID

## 2024-05-03 ENCOUNTER — Ambulatory Visit: Payer: MEDICAID

## 2024-05-03 DIAGNOSIS — M6281 Muscle weakness (generalized): Secondary | ICD-10-CM

## 2024-05-03 DIAGNOSIS — M546 Pain in thoracic spine: Secondary | ICD-10-CM

## 2024-05-03 DIAGNOSIS — M5459 Other low back pain: Secondary | ICD-10-CM

## 2024-05-03 DIAGNOSIS — M542 Cervicalgia: Secondary | ICD-10-CM
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
## 2024-05-04 ENCOUNTER — Other Ambulatory Visit: Payer: Self-pay

## 2024-05-04 ENCOUNTER — Encounter (HOSPITAL_COMMUNITY): Payer: Self-pay

## 2024-05-04 ENCOUNTER — Ambulatory Visit (HOSPITAL_COMMUNITY)
Admission: RE | Admit: 2024-05-04 | Discharge: 2024-05-04 | Disposition: A | Discharge status: Home / Self Care | Payer: MEDICAID | Source: Ambulatory Visit | Attending: Nurse Practitioner | Admitting: Nurse Practitioner

## 2024-05-04 ENCOUNTER — Emergency Department (HOSPITAL_COMMUNITY): Admission: EM | Admit: 2024-05-04 | Discharge: 2024-05-04 | Discharge status: Against Medical Advice | Payer: MEDICAID

## 2024-05-04 ENCOUNTER — Emergency Department (HOSPITAL_COMMUNITY): Payer: MEDICAID

## 2024-05-04 ENCOUNTER — Emergency Department (HOSPITAL_COMMUNITY)
Admission: EM | Admit: 2024-05-04 | Discharge: 2024-05-04 | Disposition: A | Discharge status: Home / Self Care | Payer: MEDICAID | Source: Ambulatory Visit | Attending: Emergency Medicine | Admitting: Emergency Medicine

## 2024-05-04 VITALS — BP 109/75 | HR 65 | Temp 97.9°F | Resp 18

## 2024-05-04 DIAGNOSIS — G8929 Other chronic pain: Secondary | ICD-10-CM | POA: Diagnosis not present

## 2024-05-04 DIAGNOSIS — M5116 Intervertebral disc disorders with radiculopathy, lumbar region: Secondary | ICD-10-CM | POA: Insufficient documentation

## 2024-05-04 DIAGNOSIS — M549 Dorsalgia, unspecified: Secondary | ICD-10-CM

## 2024-05-04 DIAGNOSIS — R079 Chest pain, unspecified: Secondary | ICD-10-CM | POA: Insufficient documentation

## 2024-05-04 DIAGNOSIS — M542 Cervicalgia: Secondary | ICD-10-CM | POA: Insufficient documentation

## 2024-05-04 DIAGNOSIS — M79604 Pain in right leg: Secondary | ICD-10-CM | POA: Diagnosis not present

## 2024-05-04 LAB — BASIC METABOLIC PANEL WITH GFR
Anion gap: 11 (ref 5–15)
BUN: 8 mg/dL (ref 6–20)
CO2: 25 mmol/L (ref 22–32)
Calcium: 10.2 mg/dL (ref 8.9–10.3)
Chloride: 104 mmol/L (ref 98–111)
Creatinine, Ser: 0.82 mg/dL (ref 0.44–1.00)
GFR, Estimated: >60 mL/min
Glucose, Bld: 101 mg/dL — ABNORMAL HIGH (ref 70–99)
Potassium: 4.3 mmol/L (ref 3.5–5.1)
Sodium: 140 mmol/L (ref 135–145)

## 2024-05-04 LAB — CBC
HCT: 44.5 % (ref 36.0–46.0)
Hemoglobin: 14.3 g/dL (ref 12.0–15.0)
MCH: 29.7 pg (ref 26.0–34.0)
MCHC: 32.1 g/dL (ref 30.0–36.0)
MCV: 92.5 fL (ref 80.0–100.0)
Platelets: 320 K/uL (ref 150–400)
RBC: 4.81 MIL/uL (ref 3.87–5.11)
RDW: 15.4 % (ref 11.5–15.5)
WBC: 6.2 K/uL (ref 4.0–10.5)
nRBC: 0 % (ref 0.0–0.2)

## 2024-05-04 LAB — TROPONIN T, HIGH SENSITIVITY: Troponin T High Sensitivity: 8 ng/L (ref 0–19)

## 2024-05-04 MED ORDER — GADOBUTROL 1 MMOL/ML IV SOLN
10.0000 mL | Freq: Once | INTRAVENOUS | Status: AC | PRN
  Administered 2024-05-04: 10 mL via INTRAVENOUS

## 2024-05-04 MED ORDER — LORAZEPAM 2 MG/ML IJ SOLN
1.0000 mg | Freq: Once | INTRAMUSCULAR | Status: AC | PRN
  Administered 2024-05-04: 1 mg via INTRAVENOUS
  Filled 2024-05-04: qty 1

## 2024-05-04 MED ORDER — KETOROLAC TROMETHAMINE 15 MG/ML IJ SOLN
15.0000 mg | Freq: Once | INTRAMUSCULAR | Status: AC
  Administered 2024-05-04: 15 mg via INTRAVENOUS
  Filled 2024-05-04: qty 1

## 2024-05-04 MED ORDER — METHYLPREDNISOLONE 4 MG PO TBPK: ORAL_TABLET | ORAL | 0 refills | Status: AC
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
## 2024-05-04 NOTE — ED Provider Triage Note (Signed)
 Emergency Medicine Provider Triage Evaluation Note  Melissa Davies [facility] , a 54 y.o. female  was evaluated in triage.  Pt complains of BL upper extremtiy numbness and R leg pain after upper back? Neck epidural procedure late last weak.  Review of Systems  Positive: Back and neck pain Negative: fever  Physical Exam  BP (!) 141/93   Pulse 68   Temp 98.4 F (36.9 C) (Oral)   Resp 18   LMP 05/11/2016   SpO2 100%  Gen:   Awake, no distress  F Resp:  Normal effort   MSK:   Moves extremities without difficulty   Other:     Medical Decision Making  Medically screening exam initiated at 12:31 PM.  Appropriate orders placed.  Melissa Davies Leonhard was informed that the remainder of the evaluation will be completed by another provider, this initial triage assessment does not replace that evaluation, and the importance of remaining in the ED until their evaluation is complete.      Arloa Chroman, PA-C 05/04/24 1232

## 2024-05-04 NOTE — ED Notes (Addendum)
 Pt decided to leave AMA soon as she checked in

## 2024-05-04 NOTE — Discharge Instructions (Signed)
 While you are in the emergency room, you had blood work done that was normal.  Your MRIs of your neck and upper back were negative.  Like we discussed, I think you have a bulging disc in your lower back that is contributing to your symptoms.  I sent you prescription for steroids.  Take the Dosepak as instructed by the dose packaging.  You may also take Tylenol  and the muscle relaxers that you have been prescribed.  Please follow-up with your neck and back doctor.

## 2024-05-04 NOTE — ED Triage Notes (Signed)
 Pt states had a epidural to upper back on Friday. C/o pain, numbness, and tingling from neck down to rt leg. States unable to put weight on rt leg. States went to physical therapy yesterday and pain was too bad to complete. States took tylenol  with little relief.

## 2024-05-04 NOTE — Discharge Instructions (Signed)
 Please head to the nearest emergency department for further evaluation of your pain and potential advanced imaging.
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
## 2024-05-04 NOTE — ED Notes (Signed)
 Patient is being discharged from the Urgent Care and sent to the Emergency Department via POV . Per [place] , NP, patient is in need of higher level of care due to pain and numbness after epidural injection to back. Patient is aware and verbalizes understanding of plan of care.  Vitals:   05/04/24 1036  BP: 109/75  Pulse: 65  Resp: 18  Temp: 97.9 F (36.6 C)  SpO2: 98%

## 2024-05-04 NOTE — ED Notes (Signed)
 Patient transported to MRI

## 2024-05-04 NOTE — ED Provider Notes (Addendum)
 " [facility]    CSN: [id number] Arrival date & time: 05/04/24  9062      History   Chief Complaint Chief Complaint  Patient presents with   Leg Pain    Had an epidural Friday. Right leg pain started Sunday. Getting worse. - Entered by patient    HPI Melissa Davies is a 54 y.o. female.   Patient presents to clinic over concern of right sided leg pain and numbness with back pain after an epidural done last Friday, 7 days ago.   Was told she would have pain for 3 days after epidural and that eventually it should improve  Friday she laid on the couch and iced, had throbbing Saturday and rested  Sunday she laid on the couch and her right leg 'felt like it wanted to fall off'  'Monday it about fell off again' Tuesday she called and made an apt w/ PCP for next day  Wednesday morning she could not walk to go to her PCP apt  Yesterday at her PT visit she was not able to bend her legs, numbness feels like it is coming from the back of the leg down to her toes and sometimes shooting up into her arms Yesterday on the way home from PT she called and left VM to the provider that did her epidural, has not heard back  Came into UC today d/t pain and she is panicking  Able to stand but cannot bear any weight on the right  Taking Tylenol  but she does not think this is working, heating pad as well, soothing a little  Denies fevers, is having 'real bad headaches' as well  Also mentions some chest pain and shortness of breath that happened last night, patient is concerned about a blood clot.  Has not had any swelling in the right leg.  Hx of DDD,  anxiety and depression. Takes gabapentin , Wellbutrin  and lyrica.   The history is provided by the patient and medical records.  Leg Pain   Past Medical History:  Diagnosis Date   ADHD    Anxiety    Bipolar disorder (manic depression) (HCC)    HLD (hyperlipidemia)    PTSD (post-traumatic stress disorder)    Vitamin D deficiency      Patient Active Problem List   Diagnosis Date Noted   Contusion of hand 09/20/2023   DDD (degenerative disc disease), cervical 08/18/2023   Bilateral hand pain 08/17/2023   Acute strain of neck muscle 08/17/2023   Carpal tunnel syndrome of right wrist 03/09/2023   High thyroid stimulating hormone (TSH) level 05/19/2022   Mood disorder 05/19/2022   Other obesity due to excess calories 05/19/2022   Acute otalgia, right 04/19/2020   Hearing loss of right ear due to cerumen impaction 04/19/2020   Bipolar I disorder, most recent episode depressed (HCC) 10/25/2019   MDD (major depressive disorder), recurrent episode, severe (HCC) 07/22/2019   Major depressive disorder, recurrent episode 07/22/2019   Suicidal ideation 07/21/2019   PTSD (post-traumatic stress disorder) 09/13/2013   Depression, reactive 05/24/2013   ADD (attention deficit disorder) 09/26/2012   Generalized anxiety disorder 09/26/2012   BMI 36.0-36.9,adult 09/26/2012   History of tubal ligation 09/30/2011    Past Surgical History:  Procedure Laterality Date   CARPAL TUNNEL RELEASE Bilateral    COLONOSCOPY     TUBAL LIGATION      OB History     Gravida  3   Para  3   Term  3   Preterm      AB      Living  3      SAB      IAB      Ectopic      Multiple      Live Births  3            Home Medications    Prior to Admission medications  Medication Sig Start Date End Date Taking? Authorizing Provider  buPROPion  (WELLBUTRIN  XL) 300 MG 24 hr tablet Take 300 mg by mouth daily. 10/19/23   [provider]  FLUoxetine  (PROZAC ) 20 MG capsule Take 20 mg by mouth every morning. Patient not taking: Reported on 12/07/2023 12/03/23   [provider]  gabapentin  (NEURONTIN ) 300 MG capsule Take 300 mg by mouth. ake 1 capsule (300 mg total) by mouth 3 (three) times a day. Start with one pill at night for 2 nights then one pill in morning and one at night for 2 days then up to three times a  day Patient not taking: Reported on 05/04/2024 11/09/23 11/08/24  [provider]  hydrOXYzine  (ATARAX ) 25 MG tablet Take 12.5-25 mg by mouth 3 (three) times daily as needed. 12/03/23   [provider]  MEDROL  4 MG TBPK tablet Take 4 mg by mouth as directed. Patient not taking: Reported on 12/07/2023 12/02/23   [provider]  meloxicam  (MOBIC ) 15 MG tablet Take 15 mg by mouth daily. Patient not taking: Reported on 12/07/2023 11/30/23   [provider]  methocarbamol  (ROBAXIN ) 500 MG tablet Take 1 tablet (500 mg total) by mouth 2 (two) times daily. Patient not taking: Reported on 12/07/2023 08/09/23   Mercer, Rajanae Mantia  G, FNP  methylphenidate  (RITALIN ) 10 MG tablet Take 10 mg by mouth 2 (two) times daily with breakfast and lunch. 11/09/23   [provider]  mirtazapine  (REMERON ) 7.5 MG tablet Take 7.5 mg by mouth at bedtime. Patient not taking: Reported on 12/07/2023 12/03/23   [provider]  pantoprazole  (PROTONIX ) 40 MG tablet Take 1 tablet (40 mg total) by mouth daily. Patient not taking: Reported on 12/07/2023 11/25/23   [place], PA-C  pregabalin (LYRICA) 75 MG capsule Take 75 mg by mouth 2 (two) times daily. 12/02/23   [provider]  rosuvastatin (CRESTOR) 20 MG tablet Take 20 mg by mouth daily. 10/29/23   [provider]  senna-docusate (SENOKOT S) 8.6-50 MG tablet Take 1 tablet by mouth 2 (two) times daily. Patient not taking: Reported on 12/07/2023 11/25/23 05/23/24  [place], PA-C  tiZANidine (ZANAFLEX) 4 MG tablet Take 4 mg by mouth every 6 (six) hours as needed. 12/02/23   [provider]  Vitamin D, Ergocalciferol, (DRISDOL) 1.25 MG (50000 UNIT) CAPS capsule Take 50,000 Units by mouth every 7 (seven) days. 10/29/23 09/23/24  [provider]    Family History Family History  Problem Relation Age of Onset   Ovarian cancer Mother    Lung cancer Sister        mets to bone and brain   Stomach cancer  Paternal Grandmother    ADD / ADHD Son    Colon cancer Neg Hx    Esophageal cancer Neg Hx    Rectal cancer Neg Hx     Social History Social History[1]   Allergies   Patient has no known allergies.   Review of Systems Review of Systems  Per HPI  Physical Exam Triage Vital Signs ED Triage Vitals  Encounter Vitals  Group     BP 05/04/24 1036 109/75     Girls Systolic BP Percentile --      Girls Diastolic BP Percentile --      Boys Systolic BP Percentile --      Boys Diastolic BP Percentile --      Pulse Rate 05/04/24 1036 65     Resp 05/04/24 1036 18     Temp 05/04/24 1036 97.9 F (36.6 C)     Temp Source 05/04/24 1036 Oral     SpO2 05/04/24 1036 98 %     Weight --      Height --      Head Circumference --      Peak Flow --      Pain Score 05/04/24 1035 10     Pain Loc --      Pain Education --      Exclude from Growth Chart --    No data found.  Updated Vital Signs BP 109/75 (BP Location: Right Arm)   Pulse 65   Temp 97.9 F (36.6 C) (Oral)   Resp 18   LMP 05/11/2016   SpO2 98%   Visual Acuity Right Eye Distance:   Left Eye Distance:   Bilateral Distance:    Right Eye Near:   Left Eye Near:    Bilateral Near:     Physical Exam Vitals and nursing note reviewed.  Constitutional:      Appearance: Normal appearance.  HENT:     Head: Normocephalic and atraumatic.     Right Ear: External ear normal.     Left Ear: External ear normal.     Mouth/Throat:     Mouth: Mucous membranes are moist.  Eyes:     Conjunctiva/sclera: Conjunctivae normal.  Cardiovascular:     Rate and Rhythm: Normal rate.  Pulmonary:     Effort: Pulmonary effort is normal. No respiratory distress.  Musculoskeletal:        General: Tenderness present. No swelling.     Right lower leg: No edema.     Left lower leg: No edema.  Skin:    General: Skin is warm and dry.  Neurological:     General: No focal deficit present.     Mental Status: She is alert.  Psychiatric:         Mood and Affect: Mood normal.      UC Treatments / Results  Labs (all labs ordered are listed, but only abnormal results are displayed) Labs Reviewed - No data to display  EKG   Radiology No results found.  Procedures Procedures (including critical care time)  Medications Ordered in UC Medications - No data to display  Initial Impression / Assessment and Plan / UC Course  I have reviewed the triage vital signs and the nursing notes.  Pertinent labs & imaging results that were available during my care of the patient were reviewed by me and considered in my medical decision making (see chart for details).  Vitals and triage reviewed, patient is hemodynamically stable.  Injection site without concern for cellulitis.  Diffuse tenderness along the entire spine.  Patient reports she is unable to bear weight, has had headache and recent shortness of breath and chest pain.  Discussed limitations of advanced imaging at urgent care.  Advised due to the severity of her pain a week out from her procedure that she would benefit from further advanced evaluation, patient agreeable to plan and will transport to ED via POV.  Final Clinical Impressions(s) / UC Diagnoses   Final diagnoses:  Right leg pain  Back pain, unspecified back location, unspecified back pain laterality, unspecified chronicity     Discharge Instructions      Please head to the nearest emergency department for further evaluation of your pain and potential advanced imaging.      ED Prescriptions   None    PDMP not reviewed this encounter.         [1]  Social History Tobacco Use   Smoking status: Never   Smokeless tobacco: Never  Vaping Use   Vaping status: Never Used  Substance Use Topics   Alcohol use: Not Currently   Drug use: Not Currently    Types: Marijuana     Mercer Estrellita MATSU, FNP 05/04/24 1535  "

## 2024-05-08 ENCOUNTER — Ambulatory Visit: Payer: MEDICAID

## 2024-05-11 ENCOUNTER — Ambulatory Visit: Payer: MEDICAID

## 2024-05-11 DIAGNOSIS — M5459 Other low back pain: Secondary | ICD-10-CM

## 2024-05-11 DIAGNOSIS — M546 Pain in thoracic spine: Secondary | ICD-10-CM

## 2024-05-11 DIAGNOSIS — M542 Cervicalgia: Secondary | ICD-10-CM | POA: Diagnosis not present
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
## 2024-05-15 ENCOUNTER — Ambulatory Visit: Payer: MEDICAID

## 2024-05-17 ENCOUNTER — Ambulatory Visit: Payer: MEDICAID | Attending: Nurse Practitioner

## 2024-05-19 ENCOUNTER — Ambulatory Visit: Payer: MEDICAID | Attending: Nurse Practitioner

## 2024-05-19 DIAGNOSIS — M542 Cervicalgia: Secondary | ICD-10-CM

## 2024-05-19 DIAGNOSIS — M546 Pain in thoracic spine: Secondary | ICD-10-CM

## 2024-05-19 DIAGNOSIS — M5459 Other low back pain: Secondary | ICD-10-CM
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
## 2024-05-22 ENCOUNTER — Ambulatory Visit: Payer: MEDICAID

## 2024-05-24 ENCOUNTER — Ambulatory Visit: Payer: MEDICAID

## 2024-05-26 ENCOUNTER — Ambulatory Visit: Payer: MEDICAID

## 2024-05-29 ENCOUNTER — Ambulatory Visit: Payer: MEDICAID

## 2024-06-02 ENCOUNTER — Ambulatory Visit: Payer: MEDICAID

## 2024-06-05 ENCOUNTER — Ambulatory Visit: Payer: MEDICAID

## 2024-06-09 ENCOUNTER — Ambulatory Visit: Payer: MEDICAID

## 2025-01-16 ENCOUNTER — Ambulatory Visit: Payer: MEDICAID | Admitting: Physician Assistant
